# Patient Record
Sex: Female | Born: 1935
Health system: Southern US, Community
[De-identification: ages and names within clinical notes are randomized; demographics above are authoritative.]

## PROBLEM LIST (undated history)

## (undated) DIAGNOSIS — I639 Cerebral infarction, unspecified: Secondary | ICD-10-CM

## (undated) DIAGNOSIS — I6529 Occlusion and stenosis of unspecified carotid artery: Secondary | ICD-10-CM

## (undated) DIAGNOSIS — IMO0002 Reserved for concepts with insufficient information to code with codable children: Secondary | ICD-10-CM

## (undated) DIAGNOSIS — I10 Essential (primary) hypertension: Secondary | ICD-10-CM

## (undated) DIAGNOSIS — M1990 Unspecified osteoarthritis, unspecified site: Secondary | ICD-10-CM

## (undated) HISTORY — DX: Cerebral infarction, unspecified: I63.9

## (undated) HISTORY — DX: Occlusion and stenosis of unspecified carotid artery: I65.29

## (undated) HISTORY — PX: OTHER SURGICAL HISTORY: SHX169

---

## 2001-11-05 ENCOUNTER — Other Ambulatory Visit: Admission: RE | Admit: 2001-11-05 | Discharge: 2001-11-05 | Payer: Self-pay | Admitting: Obstetrics and Gynecology

## 2002-01-14 ENCOUNTER — Ambulatory Visit (HOSPITAL_COMMUNITY): Admission: RE | Admit: 2002-01-14 | Discharge: 2002-01-14 | Payer: Self-pay

## 2009-04-02 ENCOUNTER — Encounter: Admission: RE | Admit: 2009-04-02 | Discharge: 2009-04-02 | Payer: Self-pay | Admitting: Family Medicine

## 2009-04-26 ENCOUNTER — Ambulatory Visit: Payer: Self-pay | Admitting: Surgery

## 2009-05-14 ENCOUNTER — Encounter: Payer: Self-pay | Admitting: Surgery

## 2009-05-14 ENCOUNTER — Ambulatory Visit: Payer: Self-pay | Admitting: Surgery

## 2009-05-14 ENCOUNTER — Inpatient Hospital Stay (HOSPITAL_COMMUNITY): Admission: RE | Admit: 2009-05-14 | Discharge: 2009-05-15 | Payer: Self-pay | Admitting: Surgery

## 2009-05-14 HISTORY — PX: CAROTID ENDARTERECTOMY: SUR193

## 2009-06-07 ENCOUNTER — Ambulatory Visit: Payer: Self-pay | Admitting: Surgery

## 2009-12-13 ENCOUNTER — Ambulatory Visit: Payer: Self-pay | Admitting: Surgery

## 2010-07-04 ENCOUNTER — Ambulatory Visit: Payer: Self-pay | Admitting: Surgery

## 2010-12-31 LAB — COMPREHENSIVE METABOLIC PANEL
Alkaline Phosphatase: 64 U/L (ref 39–117)
BUN: 29 mg/dL — ABNORMAL HIGH (ref 6–23)
CO2: 26 mEq/L (ref 19–32)
Chloride: 109 mEq/L (ref 96–112)
Creatinine, Ser: 1.41 mg/dL — ABNORMAL HIGH (ref 0.4–1.2)
GFR calc non Af Amer: 35 mL/min — ABNORMAL LOW (ref >60)
Glucose, Bld: 93 mg/dL (ref 70–99)
Potassium: 4.5 mEq/L (ref 3.5–5.1)
Total Bilirubin: 0.5 mg/dL (ref 0.3–1.2)

## 2010-12-31 LAB — CBC
HCT: 34.2 % — ABNORMAL LOW (ref 36.0–46.0)
HCT: 38.3 % (ref 36.0–46.0)
Hemoglobin: 11.6 g/dL — ABNORMAL LOW (ref 12.0–15.0)
Hemoglobin: 13 g/dL (ref 12.0–15.0)
MCV: 96 fL (ref 78.0–100.0)
MCV: 96.8 fL (ref 78.0–100.0)
Platelets: 177 10*3/uL (ref 150–400)
Platelets: 180 10*3/uL (ref 150–400)
RDW: 13.1 % (ref 11.5–15.5)
WBC: 5.4 10*3/uL (ref 4.0–10.5)

## 2010-12-31 LAB — URINALYSIS, ROUTINE W REFLEX MICROSCOPIC
Bilirubin Urine: NEGATIVE
Glucose, UA: NEGATIVE mg/dL
Hgb urine dipstick: NEGATIVE
Ketones, ur: NEGATIVE mg/dL
Nitrite: NEGATIVE
Protein, ur: NEGATIVE mg/dL
Specific Gravity, Urine: 1.019 (ref 1.005–1.030)
Urobilinogen, UA: 1 mg/dL (ref 0.0–1.0)
pH: 6 (ref 5.0–8.0)

## 2010-12-31 LAB — BASIC METABOLIC PANEL
BUN: 20 mg/dL (ref 6–23)
Chloride: 107 mEq/L (ref 96–112)
GFR calc non Af Amer: 49 mL/min — ABNORMAL LOW (ref >60)
Glucose, Bld: 110 mg/dL — ABNORMAL HIGH (ref 70–99)
Potassium: 3.9 mEq/L (ref 3.5–5.1)
Sodium: 139 mEq/L (ref 135–145)

## 2010-12-31 LAB — URINE MICROSCOPIC-ADD ON

## 2010-12-31 LAB — APTT: aPTT: 31 seconds (ref 24–37)

## 2010-12-31 LAB — PROTIME-INR
INR: 0.9 (ref 0.00–1.49)
Prothrombin Time: 12.4 seconds (ref 11.6–15.2)

## 2011-02-07 NOTE — Procedures (Signed)
CAROTID DUPLEX EXAM   INDICATION:  Follow up carotid artery disease.   HISTORY:  Diabetes:  No.  Cardiac:  No.  Hypertension:  Yes.  Smoking:  No.  Previous Surgery:  Right CEA, 05/14/2009, by Dr. Myra Gianotti.  CV History:  Currently asymptomatic.  Amaurosis Fugax , Paresthesias , Hemiparesis                                       RIGHT             LEFT  Brachial systolic pressure:         128               132  Brachial Doppler waveforms:         Within normal limits                Within normal limits  Vertebral direction of flow:        Antegrade         Antegrade  DUPLEX VELOCITIES (cm/sec)  CCA peak systolic                   97                109  ECA peak systolic                   326               230  ICA peak systolic                   153               139  ICA end diastolic                   30                35  PLAQUE MORPHOLOGY:                  Mixed             Mixed  PLAQUE AMOUNT:                      Moderate          Moderate  PLAQUE LOCATION:                    ICA               ICA   IMPRESSION:  1. Right internal carotid artery velocities suggest 20% to 39%      stenosis with intimal thickening/homogenous plaque noted in the      bifurcation, common carotid artery, external carotid artery, status      post carotid endarterectomy.  2. Left internal carotid artery velocities suggest 20% to 39%      stenosis.  3. Homogenous plaque noted in the common carotid artery.   ___________________________________________  V. Charlena Cross, MD   EM/MEDQ  D:  07/04/2010  T:  07/04/2010  Job:  [id number]

## 2011-02-07 NOTE — Assessment & Plan Note (Signed)
OFFICE VISIT   Dorothy Noble, Dorothy Noble  DOB:  July 15, 1922                                       04/26/2009  [id number]#:[id number]   OTHER REFERRING PHYSICIAN:  L. Lupe Carney, M.D.   REASON FOR EVALUATION:  Carotid stenosis, asymptomatic.   HISTORY:  This is an 77 year old female I am seeing for evaluation and  management of right carotid stenosis.  The patient was seen by Dr.  Clovis Riley and found to have a right carotid bruit.  She was sent for  ultrasound and found to have high-grade right carotid stenosis in the 80  to 99% range.  She is asymptomatic.  She denies having any numbness or  weakness in either extremity.  She denies amaurosis fugax.  She denies  trouble with  speech or thought process.  Her only significant medical  history of hypertension and high cholesterol.  She does complain with  some trouble with her voice however this is thought to be secondary to  her reflux disease.  She does not have a history of cigarettes.  She  does not have a history of coronary artery disease.   FAMILY HISTORY:  Noncontributory.   SOCIAL HISTORY:  She is widowed with four children.   PAST MEDICAL HISTORY:  Hypertension, hypercholesterolemia, insomnia,  depression, reflux, WPW.   PAST SURGICAL HISTORY:  Tubal ligation and cataract.   REVIEW OF SYSTEMS:  GENERAL:  Negative fevers, chills weight gain,  weight loss.  CARDIAC:  Negative.  PULMONARY:  Positive for productive cough.  GI:  Positive for reflux.  GU:  Negative.  VASCULAR:  Negative.  NEURO:  Negative.  ORTHO:  Positive for arthritis and joint pain.  PSYCH:  Positive for depression and nervousness.   MEDICATIONS:  Vytorin, Sertraline, Maxzide, amlodipine and Lipitor.   ALLERGIES:  None.   PHYSICAL EXAMINATION:  Blood pressure 154/81, pulse 58, respirations 18.  General:  This is a well-appearing female in no acute distress.  HEENT:  Normocephalic, atraumatic.  Pupils equal.  Sclerae anicteric.  Neck :  Supple.  No JVD.  Positive right carotid bruit.  Cardiovascular:  Regular rate and rhythm.  Pulmonary:  Lungs are clear bilaterally.  Abdomen:  Soft, nontender.  Extremities:  Warm and well perfused.  Neurology:  Cranial II through XII are grossly intact.  Skin:  Without  rash.  Psych:  She is alert and x3.   DIAGNOSTIC TESTS:  Duplex ultrasound was repeated today.  This reveals  80 to 99  right carotid stenosis and 40 to 59% left.  Peak systolic  velocity on the right is 663 with end-diastolic velocity of 276.   ASSESSMENT/PLAN:  Asymptomatic right carotid stenosis.   PLAN:  I had a long conversation with the patient and her daughters  today.  The patient is 89, however, is very healthy and has a greater  than 5-year life expectancy.  We discussed the risks and benefits of  proceeding with an operation versus medical management.  After extensive  discussions, we have elected to proceed with a carotid endarterectomy.  We discussed the risk of stroke, risk of nerve injury, the risk of  cardiac complications.  I feel that the patient is  in very good health  at this point in time and with her velocity profile as high as it is  both systolic and diastolic that she would  receive benefit from  proceeding with carotid endarterectomy.  I have scheduled this for  Friday August 20.  She is going to have a Myoview performed for cardiac  clearance.   Jorge Ny, MD  Electronically Signed   VWB/MEDQ  D:  04/26/2009  T:  04/27/2009  Job:  [id number]   cc:   Gretta Arab. Valentina Lucks, M.D.  Elsworth Soho, M.D.

## 2011-02-07 NOTE — Procedures (Signed)
CAROTID DUPLEX EXAM   INDICATION:  Evaluation of carotid artery disease.   HISTORY:  Diabetes:  No.  Cardiac:  No.  Hypertension:  Yes.  Smoking:  No.  Previous Surgery:  No.  CV History:  No.  Amaurosis Fugax No, Paresthesias No, Hemiparesis No.                                       RIGHT             LEFT  Brachial systolic pressure:         152               148  Brachial Doppler waveforms:         Biphasic          Biphasic  Vertebral direction of flow:        Antegrade         Antegrade  DUPLEX VELOCITIES (cm/sec)  CCA peak systolic                   119               166  ECA peak systolic                   213               329  ICA peak systolic                   663               200  ICA end diastolic                   276               48  PLAQUE MORPHOLOGY:                  Calcified         Calcified  PLAQUE AMOUNT:                      Severe            Moderate  PLAQUE LOCATION:                    BIF, ICA, ECA     BIF, ICA, ECA   IMPRESSION:  1. 80-99% right internal carotid artery stenosis.  2. 40-59% left internal carotid artery stenosis.  3. Bilateral external carotid artery stenosis.        ___________________________________________  V. Charlena Cross, MD   AC/MEDQ  D:  04/26/2009  T:  04/26/2009  Job:  [id number]

## 2011-02-07 NOTE — Assessment & Plan Note (Signed)
OFFICE VISIT   Dorothy Noble, Dorothy Noble  DOB:  1921-10-17                                       12/13/2009  [id number]#:[id number]   REASON FOR VISIT:  Follow up carotid.   HISTORY:  This is an 77 year old female found to have a high-grade right  carotid stenosis.  She underwent right carotid endarterectomy on  05/14/2009.  She also required resection of a redundant common carotid  artery with primary anastomosis.  She had been doing very well at this  time.  We have also been following a 40-59% stenosis on the left.  She  says she is having hypersensitivity with smell.  She is also complaining  of a persistent marginal mandibular neurapraxia.  She complains of some  shooting pains going down her legs as well as leg swelling.   REVIEW OF SYSTEMS:  Positive for productive cough, reflux, headaches,  arthritis, depression and anxiety.  All other review of systems are  negative, as documented in the encounter form.   SOCIAL HISTORY:  She continues to be a nonsmoker, nondrinker.   PAST MEDICAL HISTORY:  Hypertension, hypercholesterolemia, carotid  disease.   PHYSICAL EXAMINATION:  Heart rate 58, blood pressure 152/67, O2 sats are  99%.  General:  She is well-appearing in no distress.  HEENT:  Within  normal limits.  Right carotid incision is well-healed.  Cardiovascular:  Regular rate and rhythm.  Extremities are well-perfused.  She has  bilateral pitting edema.  Abdomen:  Soft.  Musculoskeletal:  Without  major deformity.  Neuro:  She has no focal weakness.  Skin is without  rash.  The skin on her lower legs is very dry.   DIAGNOSTIC STUDIES:  Carotid ultrasound was independently reviewed  today.  This reveals 40-59% stenosis on the right carotid at the low end  of the range with intimal thickening and homogeneous plaque at the  bifurcation.  The left side is 40-59% and stable.   ASSESSMENT/PLAN:  1. Status post right carotid endarterectomy:  The patient is doing  very well at this time.  She will follow up with me in 6 months      with another ultrasound.  At that time, will also assess how her      marginal mandibular neurapraxia is resolving and will also evaluate      her left side, which has been stable.  2. Pitting edema:  The patient was encouraged to continue to wear her      compression stockings.  I will see her back in 6 months.     Jorge Ny, MD  Electronically Signed   VWB/MEDQ  D:  12/13/2009  T:  12/14/2009  Job:  [id number]   cc:   Gretta Arab. Valentina Lucks, M.D.  Dr. Clovis Riley

## 2011-02-07 NOTE — Assessment & Plan Note (Signed)
OFFICE VISIT   Dorothy Noble, Dorothy Noble  DOB:  [birth year]                                       06/07/2009  [id number]#:[id number]   REASON FOR VISIT:  Follow up carotid endarterectomy.   HISTORY:  This is an 77 year old female with a high-grade right carotid  stenosis with peak systolic velocity of 663 and end diastolic velocity  of 276.  After a lengthy discussion with the family about the risks and  benefits of proceeding, decision was made to proceed with a right  carotid endarterectomy.  This was performed without incident on  05/14/2009.  The patient did require resection of a redundant common  carotid artery with primary anastomosis at the time of operation.  She  comes back in today for followup.  She is doing very well.  She has no  neurologic deficits.  Her incision is well-healed.   The patient will come back to see me in 6 months.  We will evaluate both  sides with a carotid ultrasound at that time.  She has a known 40-59%  stenosis on the left.   Jorge Ny, MD  Electronically Signed   VWB/MEDQ  D:  06/07/2009  T:  06/08/2009  Job:  [id number]   cc:   Gretta Arab. Valentina Lucks, M.D.  Elsworth Soho, M.D.

## 2011-02-07 NOTE — Discharge Summary (Signed)
Dorothy Noble, Dorothy Noble                 ACCOUNT NO.:  [id number]   MEDICAL RECORD NO.:  [id number]          PATIENT TYPE:  INP   LOCATION:  3307                         FACILITY:  [facility]   PHYSICIAN:  Juleen China IV, MDDATE OF BIRTH:  [birth year]   DATE OF ADMISSION:  05/14/2009  DATE OF DISCHARGE:  05/15/2009                               DISCHARGE SUMMARY   ADMISSION DIAGNOSIS:  Severe right carotid artery stenosis,  asymptomatic.   FINAL DISCHARGE DIAGNOSES:  1. Severe right internal carotid artery stenosis, asymptomatic status      post right carotid endarterectomy as well as resection of redundant      right common carotid artery.  2. Hypertension.  3. Hypercholesterolemia.  4. Insomnia.  5. Depression.  6. Gastroesophageal reflux disease.  7. History of Wolff-Parkinson-White's.  8. History of tubal ligation and cataract surgery.  9. No known drug allergies.   PROCEDURES:  On May 14, 2009, right carotid endarterectomy with  bovine pericardial patch angioplasty and resection of right common  carotid artery with primary anastomosis, Dr. Venida Jarvis.   BRIEF HISTORY:  Dorothy Noble is an 77 year old black female with a high-  grade right carotid artery stenosis.  She has been asymptomatic.  She  was referred to vascular surgeon, Dr. Venida Jarvis who  recommended that she undergo elective right carotid endarterectomy to  reduce her risk for future stroke.  Duplex at the [facility] office revealed 80-  99% right internal carotid artery stenosis and 40-59% stenosis on the  left.   HOSPITAL COURSE:  Dorothy Noble was electively admitted to [facility]  She underwent the previously mentioned procedure.  Postoperatively, she was extubated neurologically intact after short-  stay recovery unit, was transferred to [facility] where she  remained until discharge.  She remained neurologically intact.  She did  have some marginal mandibular nerve palsy in the  right; but otherwise,  she had facial symmetry.  Tongue was midline.  She is moving all  extremities strongly and symmetrically.  Her incision had some mild soft  tissue edema, but no evidence of hematoma.  She denied dysphagia.  Her  vitals remained stable.  She maintained sinus rhythm.  Room air  oxygenation was 97%.  By late morning of postop day #1, she was  mobilizing, voiding, tolerating regular diet.  She was felt appropriate  for discharge home.  She lives alone, but daughters were able to provide  24-hour supervision for few days once the patient discharged.  Postoperative labs showed a white count of 8.6, hemoglobin 11.6,  hematocrit 34.2, platelet count 177.  Sodium 139, potassium 3.9, BUN of  20, creatinine 1.06, blood glucose 110.   DISPOSITION:  Dorothy Noble was discharged home in stable and improved  condition on postop day #1 on May 15, 2009.   DISCHARGE MEDICATIONS:  1. Amlodipine 10 mg p.o. daily.  2. Lisinopril/hydrochlorothiazide 20/12.5 mg p.o. daily.  3. Vitamin B12 supplement daily per home regimen.  4. Aspirin 81 mg p.o. daily.  5. Vitamin C supplement per home regimen daily.  6.  Fish oil capsule per home regimen daily.  7. Garlic supplement daily.  8. Red rice yeast supplement daily both per home regimen.  9. Percocet 5/325 mg 1 tablet p.o. q.4 h. p.r.n. for pain.   DISCHARGE INSTRUCTIONS:  She is to continue a heart-healthy diet.  May  shower and clean her incisions gently with soap and water.  Avoid  driving or heavy lifting for the next 2 weeks.  The patient reports she  no longer drives.  She should see Dr. Myra Gianotti in 2-3 weeks.  Should call  sooner if she has fever greater than 101, redness or drainage from her  incision sites, severe headache or neurologic changes.      Jerold Coombe, P.A.      Jorge Ny, MD  Electronically Signed    AWZ/MEDQ  D:  05/15/2009  T:  05/15/2009  Job:  [id number]   cc:   L. Lupe Carney, M.D.   Gretta Arab Valentina Lucks, M.D.

## 2011-02-07 NOTE — Procedures (Signed)
CAROTID DUPLEX EXAM   INDICATION:  Follow up carotid artery disease.   HISTORY:  Diabetes:  No.  Cardiac:  No.  Hypertension:  Yes.  Smoking:  No.  Previous Surgery:  Right CEA, 05/14/2009 by Dr. Myra Gianotti.  CV History:  Yes, after CEA, per patient, with residual facial.  Amaurosis Fugax No, Paresthesias No, Hemiparesis                                       RIGHT               LEFT  Brachial systolic pressure:         130                 132  Brachial Doppler waveforms:         WNL                 WNL  Vertebral direction of flow:        Antegrade           Antegrade  DUPLEX VELOCITIES (cm/sec)  CCA peak systolic                   73                  126  ECA peak systolic                   259                 194  ICA peak systolic                   127                 170  ICA end diastolic                   26                  44  PLAQUE MORPHOLOGY:                  Intimal thickening  Mixed  PLAQUE AMOUNT:                      Mild                Moderate  PLAQUE LOCATION:                    CCA/bifurcation/ECA ICA/ECA/CCA   IMPRESSION:  1. Right internal carotid artery velocities are suggestive of 40-59%      stenosis (low end of range) with intimal thickening/homogenous      plaque noted in the bifurcation/common carotid artery/external      carotid artery, status post carotid endarterectomy.  2. Left internal carotid artery shows evidence of 40-59% stenosis.  3. Bilateral external carotid artery stenosis.   ___________________________________________  V. Charlena Cross, MD   AS/MEDQ  D:  12/13/2009  T:  12/14/2009  Job:  [id number]

## 2011-02-07 NOTE — Op Note (Signed)
NAMEMALAYSIA, CRANCE                 ACCOUNT NO.:  [id number]   MEDICAL RECORD NO.:  [id number]          PATIENT TYPE:  INP   LOCATION:  3307                         FACILITY:  [facility]   PHYSICIAN:  Juleen China IV, MDDATE OF BIRTH:  02/07/1922   DATE OF PROCEDURE:  05/14/2009  DATE OF DISCHARGE:                               OPERATIVE REPORT   PREOPERATIVE DIAGNOSIS:  Asymptomatic right carotid stenosis.   POSTOPERATIVE DIAGNOSIS:  Asymptomatic right carotid stenosis.   PROCEDURES PERFORMED:  1. Right carotid endarterectomy with patch angioplasty.  2. Resection of right common carotid artery with primary anastomosis.   SURGEON:  1. Charlena Cross, MD   ASSISTANT:  Jerold Coombe, PA   ANESTHESIA:  General.   BLOOD LOSS:  50 mL.   SPECIMENS:  Plaque.   COMPLICATIONS:  None.   DRAINS:  None.   INDICATIONS:  This is an 77 year old female with high-grade right  carotid stenosis.  She comes in today for operative repair.   PROCEDURE:  The patient was identified in the holding area and taken to  room #9, and she was placed supine on the table.  General endotracheal  anesthesia was administered.  The patient was prepped and draped in  standard sterile fashion.  A time-out was called.  Antibiotics were  given.  Incision was made along the anterior border of the right  sternocleidomastoid.  Cautery was used to divide the subcutaneous  tissue.  The internal jugular vein was identified and mobilized along  its anterior medial border.  A dominant common facial vein was not  identified; however, there were multiple crossing venous branches, which  were divided between 3-0 silk ties.  The omohyoid and the  sternocleidomastoid muscle were then separated with Bovie cautery.  The  carotid sheath was then opened sharply.  Common carotid artery was  circumferentially dissected free and an umbilical tape was passed.  Then  began mobilizing along the lateral border of the common  carotid artery.  Branch was first identified which may be think that the external carotid  was rotated lateral indeed this turned out to the true.  I could not  rotate the external, internal carotid to their anatomic position; and  therefore, left the external carotid lateral.  The patient had a very  high bifurcation.  The hypoglossal nerve had to be fully mobilized.  The  occipital artery was divided between silk ties.  I mobilized the  internal carotid artery up as far as I could reasonably get to.  Once I  was satisfied with the exposure, the patient was given systemic  heparinization.  After the heparin had circulated, the common, external,  and internal carotid arteries were occluded.  An #11 blade was used to  make an arteriotomy, which was extended with Potts scissors.  The  patient had approximately 95% stenosis.  There was no thrombus  visualized.  There was ulcerative plaque at the bifurcation.  An 8-  [place] shunt was placed.  It was difficult to get into the appropriate  endarterectomy plane, as there was significant inflammation around  the  carotid bifurcation.  Ultimately, endarterectomy was performed using on  [employer] elevator.  Eversion endarterectomy was performed in the  external carotid artery.  A good distal endpoint was encountered.  Once  the endarterectomy was complete, the artery was copiously irrigated and  made sure there was no distal flap.  I did notice that the internal and  common carotid artery were very redundant; therefore, I felt primary  resection would be necessary.  Four stay sutures were placed in the  common carotid artery and approximately 1.5 cm of the common carotid  artery was resected.  This was done right up to the takeoff of the  external carotid artery.  A primary anastomosis was completed using  running 6-0 Prolene.  After this was done, bovine pericardial patch was  selected.  Patch angioplasty was performed using a running 6-0  Prolene.  Prior to completion, anastomosis of the shunt was removed.  The common,  external, and internal carotid arteries were all appropriately flushed.  The artery was filled with heparinized saline again.  The anastomosis  was then complete.  The external carotid clamp was released first  followed by the common carotid artery.  Approximately 30 seconds later,  the clamp on the internal carotid artery was released.  I did place 2  repair stitches for hemostasis.  The patient was given 50 mg of  protamine to reverse the heparin.  Vita-Sure was also used to aid with  hemostasis.  Once I was satisfied with the repair and the hemostasis,  the carotid sheath was reapproximated with 3-0 Vicryl, the platysma  muscles were reapproximated with 3-0 Vicryl, and the skin was closed  with running 4-0 Vicryl.  Dermabond was placed on the skin.  The patient  was successfully awakened from anesthesia.  She was found to be moving  all 4 extremities to command.  She was taken to the recovery room in  stable condition.      Jorge Ny, MD  Electronically Signed     VWB/MEDQ  D:  05/14/2009  T:  05/15/2009  Job:  [id number]

## 2011-07-10 ENCOUNTER — Other Ambulatory Visit (INDEPENDENT_AMBULATORY_CARE_PROVIDER_SITE_OTHER): Payer: Medicare Other | Admitting: *Deleted

## 2011-07-10 DIAGNOSIS — I6529 Occlusion and stenosis of unspecified carotid artery: Secondary | ICD-10-CM

## 2011-07-10 DIAGNOSIS — Z48812 Encounter for surgical aftercare following surgery on the circulatory system: Secondary | ICD-10-CM

## 2011-08-07 NOTE — Procedures (Unsigned)
CAROTID DUPLEX EXAM  INDICATION:  Follow up right CEA.  HISTORY: Diabetes:  No. Cardiac:  No. Hypertension:  Yes. Smoking:  No. Previous Surgery:  Right CEA, 05/14/2009. CV History: Amaurosis Fugax No, Paresthesias No, Hemiparesis No.                                      RIGHT             LEFT Brachial systolic pressure:         130               128 Brachial Doppler waveforms:         WNL               WNL Vertebral direction of flow:        Antegrade         Antegrade DUPLEX VELOCITIES (cm/sec) CCA peak systolic                   94                95 ECA peak systolic                   293               242 ICA peak systolic                   114               109 ICA end diastolic                   23                24 PLAQUE MORPHOLOGY:                  Heterogenous      Heterogenous PLAQUE AMOUNT:                      Moderate          Moderate PLAQUE LOCATION:                    CCA/ECA/ICA       CCA/ECA/ICA  IMPRESSION: 1. Patent right carotid endarterectomy. 2. 1% to 39% left internal carotid artery plaquing. 3. Significant intimal thickening bilaterally. 4. Significant stenosis of bilateral external carotid arteries. 5. Stable results compared to previous examination 1 year ago.  ___________________________________________ V. Charlena Cross, MD  LT/MEDQ  D:  07/10/2011  T:  07/10/2011  Job:  [id number]

## 2011-09-03 ENCOUNTER — Encounter: Payer: Self-pay | Admitting: *Deleted

## 2011-09-03 ENCOUNTER — Emergency Department (HOSPITAL_COMMUNITY)
Admission: EM | Admit: 2011-09-03 | Discharge: 2011-09-03 | Disposition: A | Discharge status: Home / Self Care | Payer: Medicare Other | Attending: Emergency Medicine | Admitting: Emergency Medicine

## 2011-09-03 ENCOUNTER — Emergency Department (HOSPITAL_COMMUNITY): Payer: Medicare Other

## 2011-09-03 DIAGNOSIS — M25519 Pain in unspecified shoulder: Secondary | ICD-10-CM | POA: Insufficient documentation

## 2011-09-03 DIAGNOSIS — M25619 Stiffness of unspecified shoulder, not elsewhere classified: Secondary | ICD-10-CM | POA: Insufficient documentation

## 2011-09-03 DIAGNOSIS — S43006A Unspecified dislocation of unspecified shoulder joint, initial encounter: Secondary | ICD-10-CM | POA: Insufficient documentation

## 2011-09-03 DIAGNOSIS — W19XXXA Unspecified fall, initial encounter: Secondary | ICD-10-CM | POA: Insufficient documentation

## 2011-09-03 HISTORY — DX: Unspecified osteoarthritis, unspecified site: M19.90

## 2011-09-03 HISTORY — DX: Essential (primary) hypertension: I10

## 2011-09-03 HISTORY — DX: Reserved for concepts with insufficient information to code with codable children: IMO0002

## 2011-09-03 MED ORDER — OXYCODONE-ACETAMINOPHEN 5-325 MG PO TABS
1.0000 | ORAL_TABLET | Freq: Once | ORAL | Status: AC
  Administered 2011-09-03: 1 via ORAL
  Filled 2011-09-03 (×2): qty 1

## 2011-09-03 MED ORDER — PROPOFOL 10 MG/ML IV EMUL
5.0000 mL | Freq: Once | INTRAVENOUS | Status: AC
  Administered 2011-09-03: 50 mg via INTRAVENOUS
  Filled 2011-09-03: qty 20

## 2011-09-03 MED ORDER — OXYCODONE-ACETAMINOPHEN 5-325 MG PO TABS: 2.0000 | ORAL_TABLET | ORAL | Status: AC | PRN

## 2011-09-03 MED ORDER — CLONIDINE HCL 0.1 MG PO TABS
0.2000 mg | ORAL_TABLET | Freq: Once | ORAL | Status: AC
  Administered 2011-09-03: 0.2 mg via ORAL
  Filled 2011-09-03: qty 2

## 2011-09-03 MED ORDER — SODIUM CHLORIDE 0.9 % IV SOLN
INTRAVENOUS | Status: DC
  Administered 2011-09-03: 17:00:00 via INTRAVENOUS

## 2011-09-03 NOTE — ED Notes (Signed)
Per EMS- Pt .tripped and fell while at church today. Pt. Has right shoulder deformity and pain that she rates 5/10. Pt. Denies hitting her head or LOC. Pt. Denies neck or back pain. BP-150/80, HR-72, R-18.

## 2011-09-03 NOTE — ED Notes (Signed)
Steward sedation score 6 

## 2011-09-03 NOTE — ED Notes (Signed)
02 via [place] removed. Will monitor sats.

## 2011-09-03 NOTE — ED Notes (Signed)
Patient returned from XR. 

## 2011-09-03 NOTE — ED Provider Notes (Signed)
History     CSN: [id number] Arrival date & time: 09/03/2011  3:14 PM   First MD Initiated Contact with Patient 09/03/11 1515      Chief Complaint  Patient presents with  . Fall  . Shoulder Injury    (Consider location/radiation/quality/duration/timing/severity/associated sxs/prior treatment) Patient is a 77 y.o. female presenting with fall and shoulder injury. The history is provided by the patient.  Fall Pertinent negatives include no abdominal pain, no nausea, no vomiting and no headaches.  Shoulder Injury Pertinent negatives include no chest pain, no abdominal pain, no headaches and no shortness of breath.   the patient is an 77 year old, right-hand dominant female, who complains of right shoulder pain after she fell at church.  She is going up an incline and fell over, and landed on her right shoulder.  She denies hitting her head.  She denies loss of consciousness, neck pain.  She denies pain anywhere else.  She denies chest pain, shortness breath, or palpitations.  Prior to falling.  She has not been recently ill.  No past medical history on file.  No past surgical history on file.  No family history on file.  History  Substance Use Topics  . Smoking status: Not on file  . Smokeless tobacco: Not on file  . Alcohol Use: Not on file    OB History    No data available      Review of Systems  HENT: Negative for neck pain.   Eyes: Negative for visual disturbance.  Respiratory: Negative for cough and shortness of breath.   Cardiovascular: Negative for chest pain.  Gastrointestinal: Negative for nausea, vomiting and abdominal pain.  Musculoskeletal: Negative for back pain.       Right shoulder pain No elbow, forearm or wrist pain on the right side  Skin: Negative for wound.  Neurological: Negative for syncope and headaches.  Psychiatric/Behavioral: Negative for confusion.  All other systems reviewed and are negative.    Allergies  Review of patient's allergies  indicates not on file.  Home Medications  No current outpatient prescriptions on file.  BP 244/78  Pulse 66  Temp(Src) 98.5 F (36.9 C) (Oral)  Resp 20  SpO2 97%  Physical Exam  Constitutional: She is oriented to person, place, and time. She appears well-developed and well-nourished.  HENT:  Head: Normocephalic and atraumatic.  Eyes: Pupils are equal, round, and reactive to light.  Neck: Normal range of motion.  Cardiovascular: Normal rate, regular rhythm and normal heart sounds.   No murmur heard. Pulmonary/Chest: Effort normal and breath sounds normal. No respiratory distress. She has no wheezes. She has no rales.  Abdominal: Soft. She exhibits no distension. There is no tenderness.  Musculoskeletal: She exhibits no edema.       Right shoulder tenderness to palpation with decreased range of motion due to pain.  No tenderness or deformity to the humerus, elbow, forearm or wrist on the right side.  Neurological: She is alert and oriented to person, place, and time. No cranial nerve deficit.  Skin: Skin is warm and dry. No rash noted. No erythema.  Psychiatric: She has a normal mood and affect. Her behavior is normal.    ED Course  Procedures (including critical care time) 77 year old, right-hand-dominant female, complains of right shoulder pain after fall.  She has tenderness and decreased range of motion.  We will perform an x-ray to look for a fracture or dislocation.  I will give her Percocet for pain. Labs Reviewed - No data  to display  PROCEDURAL SEDATION Consent was signed by the patient. Her last meal was approximately 1 PM today. The patient was placed on a monitor.  An IV was established.  Oxygen was provided. Propofol 50 mg bolus was given.  The patient went to sleep and her right shoulder was reduced successfully without complications. The patient woke back up and returned to her baseline mental status. Physician.  Time 15 minutes  SHOULDER REDUCTION  consent was  obtained for right shoulder relocation. After sedation was achieved with propofol.  Her right shoulder was easily reduced by external rotation and abduction. The shoulder was placed in a sling and a post reduction x-ray was obtained.  There were no complications.      MDM  Right shoulder ant  Dislocation - reduced HTN- no end organ damage        Nicholes Stairs, MD 09/03/11 [phone]

## 2011-09-03 NOTE — ED Notes (Signed)
Patient transported to X-ray 

## 2012-09-19 ENCOUNTER — Encounter: Payer: Self-pay | Admitting: Vascular Surgery

## 2012-11-11 ENCOUNTER — Other Ambulatory Visit: Payer: Self-pay | Admitting: *Deleted

## 2012-11-11 DIAGNOSIS — Z48812 Encounter for surgical aftercare following surgery on the circulatory system: Secondary | ICD-10-CM

## 2012-11-11 DIAGNOSIS — I6529 Occlusion and stenosis of unspecified carotid artery: Secondary | ICD-10-CM

## 2012-11-22 ENCOUNTER — Encounter: Payer: Self-pay | Admitting: Neurosurgery

## 2012-11-25 ENCOUNTER — Other Ambulatory Visit: Payer: Medicare Other

## 2012-11-25 ENCOUNTER — Ambulatory Visit: Payer: Medicare Other | Admitting: Neurosurgery

## 2012-12-23 ENCOUNTER — Other Ambulatory Visit: Payer: Medicare Other

## 2012-12-23 ENCOUNTER — Ambulatory Visit: Payer: Medicare Other | Admitting: Neurosurgery

## 2013-02-07 ENCOUNTER — Encounter: Payer: Self-pay | Admitting: Surgery

## 2013-02-10 ENCOUNTER — Other Ambulatory Visit (INDEPENDENT_AMBULATORY_CARE_PROVIDER_SITE_OTHER): Payer: Medicare Other | Admitting: Vascular Surgery

## 2013-02-10 ENCOUNTER — Ambulatory Visit (INDEPENDENT_AMBULATORY_CARE_PROVIDER_SITE_OTHER): Payer: Medicare Other | Admitting: Surgery

## 2013-02-10 ENCOUNTER — Encounter: Payer: Self-pay | Admitting: Surgery

## 2013-02-10 DIAGNOSIS — I6529 Occlusion and stenosis of unspecified carotid artery: Secondary | ICD-10-CM

## 2013-02-10 DIAGNOSIS — Z48812 Encounter for surgical aftercare following surgery on the circulatory system: Secondary | ICD-10-CM

## 2013-02-10 NOTE — Progress Notes (Signed)
[facility]   Patient name: Dorothy Noble MRN: [id number] DOB: [birth year] Sex: female     Chief Complaint  Patient presents with  . Carotid    chart audit - last OV 12/13/2009 - pt c/o "a tickle in my throat that makes me cough at times also causes me to become hoarse"    HISTORY OF PRESENT ILLNESS: The patient is back today for followup. She is status post right carotid endarterectomy in September of 2010 4 asymptomatic right carotid stenosis. The patient had a right marginal mandibular neurapraxia following her operation. This has resolved. She states that she has no evidence of numbness or weakness in either extremity, no slurred speech, no amaurosis fugax. She does state that she will occasionally get a very dry throat and her voice will give out or become worse. She continues to be medically managed for hypertension and hyperlipidemia.  Past Medical History  Diagnosis Date  . Hypertension   . Arthritis   . Herniated disc   . Stroke   . Carotid artery occlusion     Past Surgical History  Procedure Laterality Date  . Carotid endarderectomy    . Blood clot removed from r ankle    . Carotid endarterectomy      History   Social History  . Marital Status: Widowed    Spouse Name: N/A    Number of Children: N/A  . Years of Education: N/A   Occupational History  . Not on file.   Social History Main Topics  . Smoking status: Never Smoker   . Smokeless tobacco: Never Used  . Alcohol Use: No  . Drug Use: No  . Sexually Active: No   Other Topics Concern  . Not on file   Social History Narrative  . No narrative on file    Family History  Problem Relation Age of Onset  . Hyperlipidemia Daughter   . Hypertension Daughter   . Other Daughter     varicose veins    Allergies as of 02/10/2013  . (No Known Allergies)    Current Outpatient Prescriptions on File Prior to Visit  Medication Sig Dispense Refill  . aspirin EC 81 MG tablet Take  81 mg by mouth daily.        Marland Kitchen atenolol-chlorthalidone (TENORETIC) 50-25 MG per tablet Take 1 tablet by mouth daily.        Marland Kitchen ezetimibe (ZETIA) 10 MG tablet Take 10 mg by mouth daily.        . Multiple Vitamins-Minerals (MULTIVITAMINS THER. W/MINERALS) TABS Take 1 tablet by mouth daily.        Marland Kitchen OVER THE COUNTER MEDICATION Place 1 drop into both eyes daily. Refresh tears-over the counter        No current facility-administered medications on file prior to visit.     REVIEW OF SYSTEMS: Cardiovascular: Positive for pain in legs with walking, history of DVT, leg swelling, varicose veins  Pulmonary: No productive cough, asthma or wheezing. Neurologic: No weakness, paresthesias, aphasia, or amaurosis. No dizziness. Hematologic: No bleeding problems or clotting disorders. Musculoskeletal: No joint pain or joint swelling. Gastrointestinal: No blood in stool or hematemesis Genitourinary: No dysuria or hematuria. Psychiatric:: No history of major depression. Integumentary: No rashes or ulcers. Constitutional: No fever or chills.  PHYSICAL EXAMINATION:   Vital signs are BP 181/54  Pulse 45  Ht 5\' 2"  (1.575 m)  Wt 144 lb (65.318 kg)  BMI 26.33 kg/m2  SpO2 100% General: The patient  appears their stated age. HEENT:  No gross abnormalities Pulmonary:  Non labored breathing Musculoskeletal: There are no major deformities. Neurologic: No focal weakness or paresthesias are detected, Skin: There are no ulcer or rashes noted. Psychiatric: The patient has normal affect. Cardiovascular: There is a regular rate and rhythm without significant murmur appreciated.   Diagnostic Studies Carotid duplex was performed and reviewed today. This shows a widely patent right internal carotid artery endarterectomy site was re\re stenosis less than 40%. The left-sided stenosis is less than 40% as well  Assessment: Status post right carotid endarterectomy Plan: The patient is doing very well. I reiterated to  her today that she suffered a marginal mandibular neurapraxia after her operation which has completely results. Her family was concerned that this was a stroke. I clarified this to them today. This was definitely not a stroke but rather I nerve neuropraxia.  The patient does describe symptoms of vocal cord dysfunction. She states that she will occasionally have a hoarse voice or her voice will give out. She also describes possible aspiration of saliva. I offered to refer her to ENT to have this evaluated, however the patient did not wish to have this done. She states that it does not bother her very often.  The patient will come back in one year for followup carotid duplex ultrasound  V. Charlena Cross, M.D. [facility] [place] Office: [phone] Pager:  [phone]

## 2013-02-11 NOTE — Addendum Note (Signed)
Addended by: Sharee Pimple on: 02/11/2013 08:58 AM   Modules accepted: Orders

## 2013-11-07 ENCOUNTER — Other Ambulatory Visit: Payer: Self-pay | Admitting: Surgery

## 2013-11-07 DIAGNOSIS — I6529 Occlusion and stenosis of unspecified carotid artery: Secondary | ICD-10-CM

## 2014-01-21 ENCOUNTER — Encounter: Payer: Self-pay | Admitting: Family

## 2014-01-22 ENCOUNTER — Other Ambulatory Visit (HOSPITAL_COMMUNITY): Payer: Medicare Other

## 2014-01-22 ENCOUNTER — Encounter: Payer: Self-pay | Admitting: Family

## 2014-01-22 ENCOUNTER — Ambulatory Visit (HOSPITAL_COMMUNITY)
Admission: RE | Admit: 2014-01-22 | Discharge: 2014-01-22 | Disposition: A | Discharge status: Home / Self Care | Payer: Medicare Other | Source: Ambulatory Visit | Attending: Family | Admitting: Family

## 2014-01-22 ENCOUNTER — Ambulatory Visit (INDEPENDENT_AMBULATORY_CARE_PROVIDER_SITE_OTHER): Payer: Medicare Other | Admitting: Family

## 2014-01-22 ENCOUNTER — Ambulatory Visit: Payer: Medicare Other | Admitting: Family

## 2014-01-22 VITALS — BP 130/58 | HR 53 | Resp 16 | Ht 61.0 in | Wt 137.0 lb

## 2014-01-22 DIAGNOSIS — R200 Anesthesia of skin: Secondary | ICD-10-CM

## 2014-01-22 DIAGNOSIS — I6529 Occlusion and stenosis of unspecified carotid artery: Secondary | ICD-10-CM

## 2014-01-22 DIAGNOSIS — R079 Chest pain, unspecified: Secondary | ICD-10-CM

## 2014-01-22 DIAGNOSIS — R209 Unspecified disturbances of skin sensation: Secondary | ICD-10-CM

## 2014-01-22 NOTE — Patient Instructions (Addendum)
Stroke Prevention Some medical conditions and behaviors are associated with an increased chance of having a stroke. You may prevent a stroke by making healthy choices and managing medical conditions. HOW CAN I REDUCE MY RISK OF HAVING A STROKE?   Stay physically active. Get at least 30 minutes of activity on most or all days.  Do not smoke. It may also be helpful to avoid exposure to secondhand smoke.  Limit alcohol use. Moderate alcohol use is considered to be:  No more than 2 drinks per day for men.  No more than 1 drink per day for nonpregnant women.  Eat healthy foods. This involves  Eating 5 or more servings of fruits and vegetables a day.  Following a diet that addresses high blood pressure (hypertension), high cholesterol, diabetes, or obesity.  Manage your cholesterol levels.  A diet low in saturated fat, trans fat, and cholesterol and high in fiber may control cholesterol levels.  Take any prescribed medicines to control cholesterol as directed by your health care provider.  Manage your diabetes.  A controlled-carbohydrate, controlled-sugar diet is recommended to manage diabetes.  Take any prescribed medicines to control diabetes as directed by your health care provider.  Control your hypertension.  A low-salt (sodium), low-saturated fat, low-trans fat, and low-cholesterol diet is recommended to manage hypertension.  Take any prescribed medicines to control hypertension as directed by your health care provider.  Maintain a healthy weight.  A reduced-calorie, low-sodium, low-saturated fat, low-trans fat, low-cholesterol diet is recommended to manage weight.  Stop drug abuse.  Avoid taking birth control pills.  Talk to your health care provider about the risks of taking birth control pills if you are over 8 years old, smoke, get migraines, or have ever had a blood clot.  Get evaluated for sleep disorders (sleep apnea).  Talk to your health care provider about  getting a sleep evaluation if you snore a lot or have excessive sleepiness.  Take medicines as directed by your health care provider.  For some people, aspirin or blood thinners (anticoagulants) are helpful in reducing the risk of forming abnormal blood clots that can lead to stroke. If you have the irregular heart rhythm of atrial fibrillation, you should be on a blood thinner unless there is a good reason you cannot take them.  Understand all your medicine instructions.  Make sure that other other conditions (such as anemia or atherosclerosis) are addressed. SEEK IMMEDIATE MEDICAL CARE IF:   You have sudden weakness or numbness of the face, arm, or leg, especially on one side of the body.  Your face or eyelid droops to one side.  You have sudden confusion.  You have trouble speaking (aphasia) or understanding.  You have sudden trouble seeing in one or both eyes.  You have sudden trouble walking.  You have dizziness.  You have a loss of balance or coordination.  You have a sudden, severe headache with no known cause.  You have new chest pain or an irregular heartbeat. Any of these symptoms may represent a serious problem that is an emergency. Do not wait to see if the symptoms will go away. Get medical help at once. Call your local emergency services  (911 in [place] Do not drive yourself to the hospital. Document Released: 10/19/2004 Document Revised: 07/02/2013 Document Reviewed: 03/14/2013 [facility] Patient Information 2014 [place].   Venous Stasis or Chronic Venous Insufficiency Chronic venous insufficiency, also called venous stasis, is a condition that affects the veins in the legs. The condition  prevents blood from being pumped through these veins effectively. Blood may no longer be pumped effectively from the legs back to the heart. This condition can range from mild to severe. With proper treatment, you should be able to continue with an active life. CAUSES   Chronic venous insufficiency occurs when the vein walls become stretched, weakened, or damaged or when valves within the vein are damaged. Some common causes of this include:  High blood pressure inside the veins (venous hypertension).  Increased blood pressure in the leg veins from long periods of sitting or standing.  A blood clot that blocks blood flow in a vein (deep vein thrombosis).  Inflammation of a superficial vein (phlebitis) that causes a blood clot to form. RISK FACTORS Various things can make you more likely to develop chronic venous insufficiency, including:  Family history of this condition.  Obesity.  Pregnancy.  Sedentary lifestyle.  Smoking.  Jobs requiring long periods of standing or sitting in one place.  Being a certain age. Women in their 64s and 73s and men in their 84s are more likely to develop this condition. SIGNS AND SYMPTOMS  Symptoms may include:   Varicose veins.  Skin breakdown or ulcers.  Reddened or discolored skin on the leg.  Brown, smooth, tight, and painful skin just above the ankle, usually on the inside surface (lipodermatosclerosis).  Swelling. DIAGNOSIS  To diagnose this condition, your health care provider will take a medical history and do a physical exam. The following tests may be ordered to confirm the diagnosis:  Duplex ultrasound A procedure that produces a picture of a blood vessel and nearby organs and also provides information on blood flow through the blood vessel.  Plethysmography A procedure that tests blood flow.  A venogram, or venography A procedure used to look at the veins using X-ray and dye. TREATMENT The goals of treatment are to help you return to an active life and to minimize pain or disability. Treatment will depend on the severity of the condition. Medical procedures may be needed for severe cases. Treatment options may include:   Use of compression stockings. These can help with symptoms and lower  the chances of the problem getting worse, but they do not cure the problem.  Sclerotherapy A procedure involving an injection of a material that "dissolves" the damaged veins. Other veins in the network of blood vessels take over the function of the damaged veins.  Surgery to remove the vein or cut off blood flow through the vein (vein stripping or laser ablation surgery).  Surgery to repair a valve. HOME CARE INSTRUCTIONS   Wear compression stockings as directed by your health care provider.  Only take over-the-counter or prescription medicines for pain, discomfort, or fever as directed by your health care provider.  Follow up with your health care provider as directed. SEEK MEDICAL CARE IF:   You have redness, swelling, or increasing pain in the affected area.  You see a red streak or line that extends up or down from the affected area.  You have a breakdown or loss of skin in the affected area, even if the breakdown is small.  You have an injury to the affected area. SEEK IMMEDIATE MEDICAL CARE IF:   You have an injury and open wound in the affected area.  Your pain is severe and does not improve with medicine.  You have sudden numbness or weakness in the foot or ankle below the affected area, or you have trouble moving your foot  or ankle.  You have a fever or persistent symptoms for more than 2 3 days.  You have a fever and your symptoms suddenly get worse. MAKE SURE YOU:   Understand these instructions.  Will watch your condition.  Will get help right away if you are not doing well or get worse. Document Released: 01/15/2007 Document Revised: 07/02/2013 Document Reviewed: 05/19/2013 [facility] Patient Information 2014 [place].   Obtain 20-30 mm mercury graduated pressure knee high stockings, measure your legs in the morning as discussed, get the closest size to your measurements. Put the stockings on in the morning, remove at bedtime. Elevate legs above hip  level when not walking.

## 2014-01-22 NOTE — Progress Notes (Signed)
Established Carotid Patient   History of Present Illness  Dorothy Noble is a 80 y.o. female patient of Dr. Trula Slade who is status post right carotid endarterectomy on 05/14/2009 for asymptomatic right carotid stenosis. She returns today for routine surveillance. The patient had a right marginal mandibular neurapraxia following her operation. This has resolved. She states that she has no evidence of numbness or weakness in either extremity, no slurred speech, no amaurosis fugax. Pt denies any symptoms c/o claudication. Pt states she has no swelling in her lower legs in the morning, but does later in the day.   denies New Medical or Surgical History: she has an upcoming ophthalmologist appointment next week.  Pt Diabetic: No Pt smoker: non-smoker  Pt meds include: Statin : No, she does take Red Yeast Rice ASA: Yes Other anticoagulants/antiplatelets: no   Past Medical History  Diagnosis Date  . Hypertension   . Arthritis   . Herniated disc   . Stroke   . Carotid artery occlusion     Social History History  Substance Use Topics  . Smoking status: Never Smoker   . Smokeless tobacco: Never Used  . Alcohol Use: No    Family History Family History  Problem Relation Age of Onset  . Hyperlipidemia Daughter   . Hypertension Daughter   . Other Daughter     varicose veins    Surgical History Past Surgical History  Procedure Laterality Date  . Carotid endarderectomy    . Blood clot removed from r ankle    . Carotid endarterectomy      No Known Allergies  Current Outpatient Prescriptions  Medication Sig Dispense Refill  . aspirin EC 81 MG tablet Take 81 mg by mouth daily.        Marland Kitchen atenolol-chlorthalidone (TENORETIC) 50-25 MG per tablet Take 1 tablet by mouth daily.        Marland Kitchen esomeprazole (NEXIUM) 40 MG capsule Take 40 mg by mouth daily before breakfast.      . ezetimibe (ZETIA) 10 MG tablet Take 10 mg by mouth daily.        . Multiple Vitamins-Minerals (MULTIVITAMINS THER.  W/MINERALS) TABS Take 1 tablet by mouth daily.        . Omega-3 Fatty Acids (FISH OIL) 1000 MG CAPS Take by mouth 3 (three) times daily.      Marland Kitchen OVER THE COUNTER MEDICATION Place 1 drop into both eyes daily. Refresh tears-over the counter       . Red Yeast Rice Extract (RED YEAST RICE PO) Take 2 tablets by mouth at bedtime.       No current facility-administered medications for this visit.    Review of Systems : See HPI for pertinent positives and negatives.  Physical Examination  Filed Vitals:   01/22/14 1541 01/22/14 1545  BP: 160/76 130/58  Pulse: 53 53  Resp:  16  Height:  5\' 1"  (1.549 m)  Weight:  137 lb (62.143 kg)  SpO2:  93%  Body mass index is 25.9 kg/(m^2).   General: WDWN female in NAD GAIT: normal Eyes: PERRLA Pulmonary:  Non-labored, CTAB, Negative  Rales, Negative rhonchi, & Negative wheezing.  Cardiac: regular Rhythm ,  Negative detected murmur.  VASCULAR EXAM Carotid Bruits Left Right   Negative Negative    Aorta is not palpable. Radial pulses are 2+ palpable and equal.  Gastrointestinal: soft, nontender, BS WNL, no r/g,  negative masses.  Musculoskeletal: Negative muscle atrophy/wasting. M/S 5/5 throughout, Extremities without ischemic changes. 2+ bilateral pretibial pitting edema with hemosiderin deposits.  Neurologic: A&O X 3; Appropriate Affect ; SENSATION, Speech is normal CN 2-12 intact, Pain and light touch intact in extremities, Motor exam as listed above.   Non-Invasive Vascular Imaging CAROTID DUPLEX 01/22/2014   CEREBROVASCULAR DUPLEX EVALUATION    INDICATION: Carotid artery disease, complains of right hand numbness    PREVIOUS INTERVENTION(S): Right carotid endarterectomy 05/14/2009    DUPLEX EXAM: Carotid duplex    RIGHT  LEFT  Peak Systolic Velocities (cm/s) End Diastolic Velocities (cm/s) Plaque LOCATION Peak  Systolic Velocities (cm/s) End Diastolic Velocities (cm/s) Plaque  103 12 HT CCA PROXIMAL 132 14 HT  103 15 HT CCA MID 124 15 HT  108 11 HT CCA DISTAL 141 17 HT  326 35 HT ECA 314 13 HT  118 19 HT ICA PROXIMAL 164 28 HT  108 20 - ICA MID 144 27 -  138 27 - ICA DISTAL 143 26 -    N/A ICA / CCA Ratio (PSV) 1.82  Antegrade Vertebral Flow Antegrade  086 Brachial Systolic Pressure (mmHg) 761  Biphasic Brachial Artery Waveforms Triphasic    Plaque Morphology:  HM = Homogeneous, HT = Heterogeneous, CP = Calcific Plaque, SP = Smooth Plaque, IP = Irregular Plaque     ADDITIONAL FINDINGS:     IMPRESSION: 1. Patent right carotid artery endarterectomy site with less than 40% right internal carotid artery stenosis. 2. Less than 40% left internal carotid artery stenosis. 3. Bilateral external carotid artery stenosis.    Compared to the previous exam:  No significant change.    Assessment: Dorothy Noble is a 80 y.o. female who is status post right carotid endarterectomy on 05/14/2009 for asymptomatic right carotid stenosis. The patient had a right marginal mandibular neurapraxia following her operation. This has resolved. She states that she has no evidence of numbness or weakness in either extremity, no slurred speech, no amaurosis fugax. Patent right carotid artery endarterectomy site with less than 40% right internal carotid artery stenosis. Less than 40% left internal carotid artery stenosis. Bilateral external carotid artery stenosis. The  ICA stenosis is  Unchanged from previous exam. Chronic venous insufficiency changes in her lower legs, walking, elevation of legs when not walking, and graduated knee high compression hose to wear during the day was advised.   Plan: Follow-up in 1 year with Carotid Duplex scan.   I discussed in depth with the patient the nature of atherosclerosis, and emphasized the importance of maximal medical management including strict control of blood pressure, blood  glucose, and lipid levels, obtaining regular exercise, and continued cessation of smoking.  The patient is aware that without maximal medical management the underlying atherosclerotic disease process will progress, limiting the benefit of any interventions. The patient was given information about stroke prevention and what symptoms should prompt the patient to seek immediate medical care. Thank you for allowing [place] to participate in this patient's care.  Clemon Chambers, RN, MSN, FNP-C [facility] [place] Office: [facility] Physician: Oneida Alar  01/22/2014 3:36 PM

## 2014-02-23 ENCOUNTER — Other Ambulatory Visit (HOSPITAL_COMMUNITY): Payer: Medicare Other

## 2014-02-23 ENCOUNTER — Ambulatory Visit: Payer: Medicare Other | Admitting: Family

## 2014-12-22 ENCOUNTER — Other Ambulatory Visit: Payer: Self-pay | Admitting: Family

## 2014-12-22 DIAGNOSIS — I6523 Occlusion and stenosis of bilateral carotid arteries: Secondary | ICD-10-CM

## 2014-12-22 DIAGNOSIS — Z48812 Encounter for surgical aftercare following surgery on the circulatory system: Secondary | ICD-10-CM

## 2015-01-25 ENCOUNTER — Other Ambulatory Visit (HOSPITAL_COMMUNITY): Payer: Medicare Other

## 2015-01-25 ENCOUNTER — Ambulatory Visit: Payer: Medicare Other | Admitting: Family

## 2015-02-01 ENCOUNTER — Ambulatory Visit
Admission: RE | Admit: 2015-02-01 | Discharge: 2015-02-01 | Disposition: A | Discharge status: Home / Self Care | Payer: Medicare Other | Source: Ambulatory Visit | Attending: Family Medicine | Admitting: Family Medicine

## 2015-02-01 ENCOUNTER — Other Ambulatory Visit: Payer: Self-pay | Admitting: Family Medicine

## 2015-02-01 DIAGNOSIS — R609 Edema, unspecified: Secondary | ICD-10-CM

## 2015-02-23 ENCOUNTER — Encounter: Payer: Self-pay | Admitting: Family

## 2015-02-24 ENCOUNTER — Other Ambulatory Visit: Payer: Self-pay | Admitting: Surgery

## 2015-02-24 DIAGNOSIS — I6529 Occlusion and stenosis of unspecified carotid artery: Secondary | ICD-10-CM

## 2015-02-25 ENCOUNTER — Encounter: Payer: Self-pay | Admitting: Family

## 2015-02-25 ENCOUNTER — Ambulatory Visit (INDEPENDENT_AMBULATORY_CARE_PROVIDER_SITE_OTHER): Payer: Medicare Other | Admitting: Family

## 2015-02-25 ENCOUNTER — Ambulatory Visit (HOSPITAL_COMMUNITY)
Admission: RE | Admit: 2015-02-25 | Discharge: 2015-02-25 | Disposition: A | Discharge status: Home / Self Care | Payer: Medicare Other | Source: Ambulatory Visit | Attending: Family | Admitting: Family

## 2015-02-25 VITALS — BP 110/62 | HR 55 | Resp 16 | Ht 63.5 in | Wt 129.0 lb

## 2015-02-25 DIAGNOSIS — Z9889 Other specified postprocedural states: Secondary | ICD-10-CM

## 2015-02-25 DIAGNOSIS — I6523 Occlusion and stenosis of bilateral carotid arteries: Secondary | ICD-10-CM | POA: Insufficient documentation

## 2015-02-25 DIAGNOSIS — I6529 Occlusion and stenosis of unspecified carotid artery: Secondary | ICD-10-CM | POA: Diagnosis present

## 2015-02-25 DIAGNOSIS — Z48812 Encounter for surgical aftercare following surgery on the circulatory system: Secondary | ICD-10-CM

## 2015-02-25 NOTE — Patient Instructions (Signed)
Stroke Prevention Some medical conditions and behaviors are associated with an increased chance of having a stroke. You may prevent a stroke by making healthy choices and managing medical conditions. HOW CAN I REDUCE MY RISK OF HAVING A STROKE?   Stay physically active. Get at least 30 minutes of activity on most or all days.  Do not smoke. It may also be helpful to avoid exposure to secondhand smoke.  Limit alcohol use. Moderate alcohol use is considered to be:  No more than 2 drinks per day for men.  No more than 1 drink per day for nonpregnant women.  Eat healthy foods. This involves:  Eating 5 or more servings of fruits and vegetables a day.  Making dietary changes that address high blood pressure (hypertension), high cholesterol, diabetes, or obesity.  Manage your cholesterol levels.  Making food choices that are high in fiber and low in saturated fat, trans fat, and cholesterol may control cholesterol levels.  Take any prescribed medicines to control cholesterol as directed by your health care provider.  Manage your diabetes.  Controlling your carbohydrate and sugar intake is recommended to manage diabetes.  Take any prescribed medicines to control diabetes as directed by your health care provider.  Control your hypertension.  Making food choices that are low in salt (sodium), saturated fat, trans fat, and cholesterol is recommended to manage hypertension.  Take any prescribed medicines to control hypertension as directed by your health care provider.  Maintain a healthy weight.  Reducing calorie intake and making food choices that are low in sodium, saturated fat, trans fat, and cholesterol are recommended to manage weight.  Stop drug abuse.  Avoid taking birth control pills.  Talk to your health care provider about the risks of taking birth control pills if you are over 35 years old, smoke, get migraines, or have ever had a blood clot.  Get evaluated for sleep  disorders (sleep apnea).  Talk to your health care provider about getting a sleep evaluation if you snore a lot or have excessive sleepiness.  Take medicines only as directed by your health care provider.  For some people, aspirin or blood thinners (anticoagulants) are helpful in reducing the risk of forming abnormal blood clots that can lead to stroke. If you have the irregular heart rhythm of atrial fibrillation, you should be on a blood thinner unless there is a good reason you cannot take them.  Understand all your medicine instructions.  Make sure that other conditions (such as anemia or atherosclerosis) are addressed. SEEK IMMEDIATE MEDICAL CARE IF:   You have sudden weakness or numbness of the face, arm, or leg, especially on one side of the body.  Your face or eyelid droops to one side.  You have sudden confusion.  You have trouble speaking (aphasia) or understanding.  You have sudden trouble seeing in one or both eyes.  You have sudden trouble walking.  You have dizziness.  You have a loss of balance or coordination.  You have a sudden, severe headache with no known cause.  You have new chest pain or an irregular heartbeat. Any of these symptoms may represent a serious problem that is an emergency. Do not wait to see if the symptoms will go away. Get medical help at once. Call your local emergency services (911 in [place] Do not drive yourself to the hospital. Document Released: 10/19/2004 Document Revised: 01/26/2014 Document Reviewed: 03/14/2013 [employer] Patient Information 2015 [employer]. This information is not intended to replace advice given   to you by your health care provider. Make sure you discuss any questions you have with your health care provider.  

## 2015-02-25 NOTE — Addendum Note (Signed)
Addended by: Dorthula Rue L on: 02/25/2015 04:48 PM   Modules accepted: Orders

## 2015-02-25 NOTE — Progress Notes (Signed)
Established Carotid Patient   History of Present Illness  Dorothy Noble is a 81 y.o. female patient of Dr. Trula Slade who is status post right carotid endarterectomy on 05/14/2009 for asymptomatic right carotid stenosis. She returns today for routine surveillance. The patient had a right marginal mandibular neurapraxia following her operation. This has resolved. She states that she has no evidence of numbness or weakness in either extremity. She denies any history of stroke or TIA. Pt denies any symptoms of claudication, denies non healing wounds. Pt states she has no swelling in her lower legs in the morning, but does later in the day.  Pt denies New Medical or Surgical History: edema in legs with weeping in left leg started March 2016, seen by dermatologist re this; wears compression hose. Weeping stopped in left leg in May 2016 since she has been elevating her legs and using compression hose.  Pt Diabetic: No Pt smoker: non-smoker  Pt meds include: Statin : No, she does take Red Yeast Rice ASA: Yes Other anticoagulants/antiplatelets: no    Past Medical History  Diagnosis Date  . Hypertension   . Arthritis   . Herniated disc   . Stroke   . Carotid artery occlusion     Social History History  Substance Use Topics  . Smoking status: Never Smoker   . Smokeless tobacco: Never Used  . Alcohol Use: No    Family History Family History  Problem Relation Age of Onset  . Hyperlipidemia Daughter   . Hypertension Daughter   . Other Daughter     varicose veins  . Varicose Veins Daughter   . Cancer Sister     Breast  . Hyperlipidemia Sister   . Hypertension Sister   . Hypertension Daughter   . Hyperlipidemia Daughter     Surgical History Past Surgical History  Procedure Laterality Date  . Carotid endarderectomy    . Blood clot removed from r ankle    . Carotid endarterectomy Right 05-14-09    cea    No Known Allergies  Current Outpatient Prescriptions  Medication  Sig Dispense Refill  . aspirin EC 81 MG tablet Take 81 mg by mouth daily.      Marland Kitchen atenolol-chlorthalidone (TENORETIC) 50-25 MG per tablet Take 1 tablet by mouth daily.      Marland Kitchen esomeprazole (NEXIUM) 40 MG capsule Take 40 mg by mouth daily before breakfast.    . ezetimibe (ZETIA) 10 MG tablet Take 10 mg by mouth daily.      . Multiple Vitamins-Minerals (MULTIVITAMINS THER. W/MINERALS) TABS Take 1 tablet by mouth daily.      . Omega-3 Fatty Acids (FISH OIL) 1000 MG CAPS Take by mouth 3 (three) times daily.    Marland Kitchen OVER THE COUNTER MEDICATION Place 1 drop into both eyes daily. Refresh tears-over the counter     . Red Yeast Rice Extract (RED YEAST RICE PO) Take 2 tablets by mouth at bedtime.     No current facility-administered medications for this visit.    Review of Systems : See HPI for pertinent positives and negatives.  Physical Examination  Filed Vitals:   02/25/15 1513 02/25/15 1523  BP: 118/70 110/62  Pulse: 62 55  Resp:  16  Height:  5' 3.5" (1.613 m)  Weight:  129 lb (58.514 kg)  SpO2:  100%   Body mass index is 22.49 kg/(m^2).   General: WDWN female in NAD GAIT: normal Eyes: PERRLA Pulmonary: Non-labored, CTAB, Negative Rales, Negative rhonchi, & Negative wheezing.  Cardiac:  regular Rhythm, no detected murmur.  VASCULAR EXAM Carotid Bruits Left Right   Negative Negative   Aorta is not palpable. Radial pulses are 2+ palpable and equal.     Gastrointestinal: soft, nontender, BS WNL, no r/g,no palpable masses.  Musculoskeletal: Negative muscle atrophy/wasting. M/S 4/5 throughout, Extremities without ischemic changes. 2+ bilateral pitting edema on dorsal aspects of feet, knee high compression stocking in place left leg, kerlex and ace wrap in place left lower leg.  Neurologic: A&O X 3; Appropriate Affect, Speech is normal CN 2-12  intact, Pain and light touch intact in extremities, Motor exam as listed above.         Non-Invasive Vascular Imaging CAROTID DUPLEX 02/25/2015   CEREBROVASCULAR DUPLEX EVALUATION    INDICATION: Carotid artery disease    PREVIOUS INTERVENTION(S): Right carotid endarterectomy 05/14/2009    DUPLEX EXAM: Carotid duplex    RIGHT  LEFT  Peak Systolic Velocities (cm/s) End Diastolic Velocities (cm/s) Plaque LOCATION Peak Systolic Velocities (cm/s) End Diastolic Velocities (cm/s) Plaque  79 6 HT CCA PROXIMAL 104 15 HT  95 14 HT CCA MID 125 16 HT  105 13 HT CCA DISTAL 113 14 HT  233 13 HT ECA 311 11 HT  100 14 - ICA PROXIMAL 143 20 HT  113 18 - ICA MID 116 26 -  106 26 - ICA DISTAL 134 24 -    N/A ICA / CCA Ratio (PSV) 1.1  Antegrade Vertebral Flow Antegrade  176 Brachial Systolic Pressure (mmHg) 160  Biphasic Brachial Artery Waveforms Biphasic    Plaque Morphology:  HM = Homogeneous, HT = Heterogeneous, CP = Calcific Plaque, SP = Smooth Plaque, IP = Irregular Plaque  ADDITIONAL FINDINGS:     IMPRESSION: 1. Patent right carotid endarterectomy site with 1 - 49% internal carotid artery stenosis 2. 1 - 49% left internal carotid artery stenosis. 3. Bilateral external carotid artery stenosis    Compared to the previous exam:  No change since 01/22/2014     Assessment: Dorothy Noble is a 81 y.o. female who is status post right carotid endarterectomy on 05/14/2009 for asymptomatic right carotid stenosis. She has no history of stroke or TIA. Today's carotid Duplex suggests a patent right carotid endarterectomy site with 1 - 49% internal carotid artery stenosis, 1 - 49% left internal carotid artery stenosis, and bilateral external carotid artery stenosis. No change since 01/22/2014    Plan: Follow-up in 1 year with Carotid Duplex.   I discussed in depth with the patient the nature of atherosclerosis, and emphasized the importance of maximal medical management including strict control of  blood pressure, blood glucose, and lipid levels, obtaining regular exercise, and continued cessation of smoking.  The patient is aware that without maximal medical management the underlying atherosclerotic disease process will progress, limiting the benefit of any interventions. The patient was given information about stroke prevention and what symptoms should prompt the patient to seek immediate medical care. Thank you for allowing [place] to participate in this patient's care.  Clemon Chambers, RN, MSN, FNP-C [facility] Office: [phone]  Clinic Physician: Oneida Alar  02/25/2015 3:19 PM

## 2015-04-21 ENCOUNTER — Encounter (INDEPENDENT_AMBULATORY_CARE_PROVIDER_SITE_OTHER): Payer: Medicare Other | Admitting: Ophthalmology

## 2015-04-21 DIAGNOSIS — I10 Essential (primary) hypertension: Secondary | ICD-10-CM | POA: Diagnosis not present

## 2015-04-21 DIAGNOSIS — H43813 Vitreous degeneration, bilateral: Secondary | ICD-10-CM | POA: Diagnosis not present

## 2015-04-21 DIAGNOSIS — H35033 Hypertensive retinopathy, bilateral: Secondary | ICD-10-CM

## 2015-04-21 DIAGNOSIS — H34812 Central retinal vein occlusion, left eye: Secondary | ICD-10-CM | POA: Diagnosis not present

## 2015-04-21 DIAGNOSIS — H4312 Vitreous hemorrhage, left eye: Secondary | ICD-10-CM

## 2015-05-19 ENCOUNTER — Encounter (INDEPENDENT_AMBULATORY_CARE_PROVIDER_SITE_OTHER): Payer: Medicare Other | Admitting: Ophthalmology

## 2015-05-19 DIAGNOSIS — H43813 Vitreous degeneration, bilateral: Secondary | ICD-10-CM | POA: Diagnosis not present

## 2015-05-19 DIAGNOSIS — I10 Essential (primary) hypertension: Secondary | ICD-10-CM

## 2015-05-19 DIAGNOSIS — H4313 Vitreous hemorrhage, bilateral: Secondary | ICD-10-CM

## 2015-05-19 DIAGNOSIS — H34812 Central retinal vein occlusion, left eye: Secondary | ICD-10-CM

## 2015-05-19 DIAGNOSIS — H35033 Hypertensive retinopathy, bilateral: Secondary | ICD-10-CM | POA: Diagnosis not present

## 2015-06-17 ENCOUNTER — Encounter (INDEPENDENT_AMBULATORY_CARE_PROVIDER_SITE_OTHER): Payer: Medicare Other | Admitting: Ophthalmology

## 2015-06-17 DIAGNOSIS — H43813 Vitreous degeneration, bilateral: Secondary | ICD-10-CM | POA: Diagnosis not present

## 2015-06-17 DIAGNOSIS — H34812 Central retinal vein occlusion, left eye: Secondary | ICD-10-CM

## 2015-06-17 DIAGNOSIS — I10 Essential (primary) hypertension: Secondary | ICD-10-CM

## 2015-06-17 DIAGNOSIS — H35033 Hypertensive retinopathy, bilateral: Secondary | ICD-10-CM | POA: Diagnosis not present

## 2015-06-24 ENCOUNTER — Emergency Department (HOSPITAL_COMMUNITY): Payer: Medicare Other

## 2015-06-24 ENCOUNTER — Observation Stay (HOSPITAL_COMMUNITY)
Admission: EM | Admit: 2015-06-24 | Discharge: 2015-06-26 | Disposition: A | Discharge status: Home Health | Payer: Medicare Other | Attending: Internal Medicine | Admitting: Internal Medicine

## 2015-06-24 ENCOUNTER — Encounter (HOSPITAL_COMMUNITY): Payer: Self-pay | Admitting: Neurology

## 2015-06-24 DIAGNOSIS — Z6822 Body mass index (BMI) 22.0-22.9, adult: Secondary | ICD-10-CM | POA: Diagnosis not present

## 2015-06-24 DIAGNOSIS — I872 Venous insufficiency (chronic) (peripheral): Secondary | ICD-10-CM | POA: Insufficient documentation

## 2015-06-24 DIAGNOSIS — N183 Chronic kidney disease, stage 3 (moderate): Secondary | ICD-10-CM | POA: Diagnosis not present

## 2015-06-24 DIAGNOSIS — I129 Hypertensive chronic kidney disease with stage 1 through stage 4 chronic kidney disease, or unspecified chronic kidney disease: Secondary | ICD-10-CM | POA: Diagnosis not present

## 2015-06-24 DIAGNOSIS — R2681 Unsteadiness on feet: Secondary | ICD-10-CM | POA: Diagnosis not present

## 2015-06-24 DIAGNOSIS — E860 Dehydration: Secondary | ICD-10-CM | POA: Diagnosis not present

## 2015-06-24 DIAGNOSIS — I441 Atrioventricular block, second degree: Secondary | ICD-10-CM | POA: Diagnosis not present

## 2015-06-24 DIAGNOSIS — Z7982 Long term (current) use of aspirin: Secondary | ICD-10-CM | POA: Diagnosis not present

## 2015-06-24 DIAGNOSIS — Z8673 Personal history of transient ischemic attack (TIA), and cerebral infarction without residual deficits: Secondary | ICD-10-CM | POA: Diagnosis not present

## 2015-06-24 DIAGNOSIS — E440 Moderate protein-calorie malnutrition: Secondary | ICD-10-CM | POA: Diagnosis not present

## 2015-06-24 DIAGNOSIS — R001 Bradycardia, unspecified: Secondary | ICD-10-CM | POA: Insufficient documentation

## 2015-06-24 DIAGNOSIS — N179 Acute kidney failure, unspecified: Secondary | ICD-10-CM | POA: Insufficient documentation

## 2015-06-24 DIAGNOSIS — R531 Weakness: Principal | ICD-10-CM

## 2015-06-24 DIAGNOSIS — I10 Essential (primary) hypertension: Secondary | ICD-10-CM

## 2015-06-24 LAB — CBC
HEMATOCRIT: 42.7 % (ref 36.0–46.0)
HEMOGLOBIN: 14.4 g/dL (ref 12.0–15.0)
MCH: 31.7 pg (ref 26.0–34.0)
MCHC: 33.7 g/dL (ref 30.0–36.0)
MCV: 94.1 fL (ref 78.0–100.0)
PLATELETS: 178 10*3/uL (ref 150–400)
RBC: 4.54 MIL/uL (ref 3.87–5.11)
RDW: 13.6 % (ref 11.5–15.5)
WBC: 6.5 10*3/uL (ref 4.0–10.5)

## 2015-06-24 LAB — CREATININE, SERUM
Creatinine, Ser: 1.37 mg/dL — ABNORMAL HIGH (ref 0.44–1.00)
GFR calc non Af Amer: 33 mL/min — ABNORMAL LOW (ref >60)
GFR, EST AFRICAN AMERICAN: 38 mL/min — AB (ref >60)

## 2015-06-24 LAB — URINALYSIS, ROUTINE W REFLEX MICROSCOPIC
BILIRUBIN URINE: NEGATIVE
Glucose, UA: 100 mg/dL — AB
Ketones, ur: 15 mg/dL — AB
LEUKOCYTES UA: NEGATIVE
NITRITE: NEGATIVE
Protein, ur: 100 mg/dL — AB
SPECIFIC GRAVITY, URINE: 1.011 (ref 1.005–1.030)
UROBILINOGEN UA: 0.2 mg/dL (ref 0.0–1.0)
pH: 7.5 (ref 5.0–8.0)

## 2015-06-24 LAB — HEPATIC FUNCTION PANEL
ALBUMIN: 3.9 g/dL (ref 3.5–5.0)
ALT: 32 U/L (ref 14–54)
AST: 40 U/L (ref 15–41)
Alkaline Phosphatase: 104 U/L (ref 38–126)
Bilirubin, Direct: <0.1 mg/dL — ABNORMAL LOW (ref 0.1–0.5)
TOTAL PROTEIN: 8.3 g/dL — AB (ref 6.5–8.1)
Total Bilirubin: 0.7 mg/dL (ref 0.3–1.2)

## 2015-06-24 LAB — CBC WITH DIFFERENTIAL/PLATELET
Basophils Absolute: 0 10*3/uL (ref 0.0–0.1)
Basophils Relative: 0 %
Eosinophils Absolute: 0 10*3/uL (ref 0.0–0.7)
Eosinophils Relative: 0 %
HEMATOCRIT: 47.9 % — AB (ref 36.0–46.0)
HEMOGLOBIN: 16 g/dL — AB (ref 12.0–15.0)
LYMPHS ABS: 0.9 10*3/uL (ref 0.7–4.0)
LYMPHS PCT: 18 %
MCH: 31.4 pg (ref 26.0–34.0)
MCHC: 33.4 g/dL (ref 30.0–36.0)
MCV: 94.1 fL (ref 78.0–100.0)
MONOS PCT: 4 %
Monocytes Absolute: 0.2 10*3/uL (ref 0.1–1.0)
NEUTROS PCT: 78 %
Neutro Abs: 4.1 10*3/uL (ref 1.7–7.7)
Platelets: 177 10*3/uL (ref 150–400)
RBC: 5.09 MIL/uL (ref 3.87–5.11)
RDW: 13.3 % (ref 11.5–15.5)
WBC: 5.2 10*3/uL (ref 4.0–10.5)

## 2015-06-24 LAB — CK TOTAL AND CKMB (NOT AT ARMC)
CK, MB: 3.3 ng/mL (ref 0.5–5.0)
Relative Index: INVALID (ref 0.0–2.5)
Total CK: 38 U/L (ref 38–234)

## 2015-06-24 LAB — BASIC METABOLIC PANEL
Anion gap: 12 (ref 5–15)
BUN: 28 mg/dL — AB (ref 6–20)
CHLORIDE: 103 mmol/L (ref 101–111)
CO2: 24 mmol/L (ref 22–32)
Calcium: 10.4 mg/dL — ABNORMAL HIGH (ref 8.9–10.3)
Creatinine, Ser: 1.24 mg/dL — ABNORMAL HIGH (ref 0.44–1.00)
GFR calc Af Amer: 43 mL/min — ABNORMAL LOW (ref >60)
GFR calc non Af Amer: 37 mL/min — ABNORMAL LOW (ref >60)
GLUCOSE: 121 mg/dL — AB (ref 65–99)
POTASSIUM: 5.6 mmol/L — AB (ref 3.5–5.1)
Sodium: 139 mmol/L (ref 135–145)

## 2015-06-24 LAB — TROPONIN I: Troponin I: <0.03 ng/mL (ref <0.031)

## 2015-06-24 LAB — I-STAT TROPONIN, ED: Troponin i, poc: 0.01 ng/mL (ref 0.00–0.08)

## 2015-06-24 LAB — URINE MICROSCOPIC-ADD ON

## 2015-06-24 LAB — POTASSIUM: Potassium: 4.6 mmol/L (ref 3.5–5.1)

## 2015-06-24 MED ORDER — HYDRALAZINE HCL 20 MG/ML IJ SOLN
10.0000 mg | INTRAMUSCULAR | Status: AC
  Administered 2015-06-24: 10 mg via INTRAVENOUS
  Filled 2015-06-24: qty 1

## 2015-06-24 MED ORDER — DOXYCYCLINE HYCLATE 100 MG PO TABS
100.0000 mg | ORAL_TABLET | Freq: Two times a day (BID) | ORAL | Status: DC
  Administered 2015-06-24 – 2015-06-26 (×4): 100 mg via ORAL
  Filled 2015-06-24 (×8): qty 1

## 2015-06-24 MED ORDER — ASPIRIN EC 81 MG PO TBEC
81.0000 mg | DELAYED_RELEASE_TABLET | Freq: Every day | ORAL | Status: DC
  Administered 2015-06-24 – 2015-06-26 (×3): 81 mg via ORAL
  Filled 2015-06-24 (×3): qty 1

## 2015-06-24 MED ORDER — ACETAMINOPHEN 325 MG PO TABS: 650.0000 mg | ORAL_TABLET | Freq: Four times a day (QID) | ORAL | Status: DC | PRN

## 2015-06-24 MED ORDER — ADULT MULTIVITAMIN W/MINERALS CH
1.0000 | ORAL_TABLET | Freq: Every day | ORAL | Status: DC
  Administered 2015-06-25 – 2015-06-26 (×2): 1 via ORAL
  Filled 2015-06-24 (×2): qty 1

## 2015-06-24 MED ORDER — ONDANSETRON HCL 4 MG PO TABS
4.0000 mg | ORAL_TABLET | Freq: Four times a day (QID) | ORAL | Status: DC | PRN
Start: 2015-06-24 — End: 2015-06-26

## 2015-06-24 MED ORDER — SODIUM CHLORIDE 0.9 % IV SOLN
INTRAVENOUS | Status: DC
  Administered 2015-06-24 – 2015-06-25 (×3): via INTRAVENOUS

## 2015-06-24 MED ORDER — ACETAMINOPHEN 650 MG RE SUPP: 650.0000 mg | Freq: Four times a day (QID) | RECTAL | Status: DC | PRN

## 2015-06-24 MED ORDER — ONDANSETRON HCL 4 MG/2ML IJ SOLN: 4.0000 mg | Freq: Four times a day (QID) | INTRAMUSCULAR | Status: DC | PRN

## 2015-06-24 MED ORDER — ENOXAPARIN SODIUM 30 MG/0.3ML ~~LOC~~ SOLN
30.0000 mg | SUBCUTANEOUS | Status: DC
  Administered 2015-06-24 – 2015-06-25 (×2): 30 mg via SUBCUTANEOUS
  Filled 2015-06-24 (×2): qty 0.3

## 2015-06-24 MED ORDER — SODIUM CHLORIDE 0.9 % IJ SOLN
3.0000 mL | Freq: Two times a day (BID) | INTRAMUSCULAR | Status: DC
  Administered 2015-06-26: 3 mL via INTRAVENOUS

## 2015-06-24 MED ORDER — LABETALOL HCL 5 MG/ML IV SOLN
10.0000 mg | INTRAVENOUS | Status: DC | PRN
  Filled 2015-06-24: qty 4

## 2015-06-24 MED ORDER — ATENOLOL 50 MG PO TABS
50.0000 mg | ORAL_TABLET | Freq: Every day | ORAL | Status: DC
  Administered 2015-06-24: 50 mg via ORAL
  Filled 2015-06-24 (×2): qty 1

## 2015-06-24 NOTE — ED Notes (Signed)
EDP at the bedside.  ?

## 2015-06-24 NOTE — ED Notes (Signed)
Per [facility]- Pt reports feeling weak since last night, also numbness tingling to right arm for several months. Pt lives alone. Pt is alert and oriented. BP 130 palpated, HR 66.

## 2015-06-24 NOTE — ED Notes (Signed)
Report attempted, RN states is change of shift they will call back when ready, bed assign for 20 min already.

## 2015-06-24 NOTE — ED Notes (Signed)
Pt eating at this time we will ambulate pt as soon as she finish with food.

## 2015-06-24 NOTE — ED Provider Notes (Signed)
CSN: [id number]     Arrival date & time 06/24/15  1049 History   First MD Initiated Contact with Patient 06/24/15 1049     Chief Complaint  Patient presents with  . Weakness     (Consider location/radiation/quality/duration/timing/severity/associated sxs/prior Treatment) The history is provided by the patient and medical records.    This is a 81 year old female with history of hypertension, arthritis, carotid artery occlusion status post right carotid endarterectomy in 2010, prior CVA, presenting to the ED for weakness since last night at unknown time.  Weakness appears generalized, no focal weakness.  No falls or other injuries-- patient uses walker at baseline.  Does have some paresthesias of her right arm which have been chronic for several months-- was told was due to decreased circulation by vascular surgery, Dr. Trula Slade.  She also had some alleged nerve damage from shoulder dislocation several years ago. Patient has continued eating and drinking normally, no abdominal pain, vomiting, or diarrhea.  No fever, chills.  No chest pain or SOB.  Patient states she has been urinating more frequently than normal but denies dysuria or hematuria.  Family has been arranging home health for patient at home.  VSS.  Past Medical History  Diagnosis Date  . Hypertension   . Arthritis   . Herniated disc   . Stroke   . Carotid artery occlusion    Past Surgical History  Procedure Laterality Date  . Carotid endarderectomy    . Blood clot removed from r ankle    . Carotid endarterectomy Right 05-14-09    cea   Family History  Problem Relation Age of Onset  . Hyperlipidemia Daughter   . Hypertension Daughter   . Other Daughter     varicose veins  . Varicose Veins Daughter   . Cancer Sister     Breast  . Hyperlipidemia Sister   . Hypertension Sister   . Hypertension Daughter   . Hyperlipidemia Daughter    Social History  Substance Use Topics  . Smoking status: Never Smoker   . Smokeless  tobacco: Never Used  . Alcohol Use: No   OB History    No data available     Review of Systems  Neurological: Positive for weakness.  All other systems reviewed and are negative.     Allergies  Review of patient's allergies indicates no known allergies.  Home Medications   Prior to Admission medications   Medication Sig Start Date End Date Taking? Authorizing Provider  aspirin EC 81 MG tablet Take 81 mg by mouth daily.      Historical Provider, MD  atenolol (TENORMIN) 50 MG tablet Take 50 mg by mouth daily. 02/15/15   Historical Provider, MD  atenolol-chlorthalidone (TENORETIC) 50-25 MG per tablet Take 1 tablet by mouth daily.      Historical Provider, MD  esomeprazole (NEXIUM) 40 MG capsule Take 40 mg by mouth daily before breakfast.    Historical Provider, MD  ezetimibe (ZETIA) 10 MG tablet Take 10 mg by mouth daily.      Historical Provider, MD  fluticasone (FLONASE) 50 MCG/ACT nasal spray as needed. 01/26/15   Historical Provider, MD  Multiple Vitamins-Minerals (MULTIVITAMINS THER. W/MINERALS) TABS Take 1 tablet by mouth daily.      Historical Provider, MD  Omega-3 Fatty Acids (FISH OIL) 1000 MG CAPS Take by mouth 3 (three) times daily.    Historical Provider, MD  OVER THE COUNTER MEDICATION Place 1 drop into both eyes daily. Refresh tears-over the counter  Historical Provider, MD  Red Yeast Rice Extract (RED YEAST RICE PO) Take 2 tablets by mouth at bedtime.    Historical Provider, MD  triamcinolone cream (KENALOG) 0.1 % Apply topically as needed. 02/19/15   Historical Provider, MD   BP 129/87 mmHg  Pulse 69  Temp(Src) 97.4 F (36.3 C) (Oral)  Resp 14  SpO2 99%   Physical Exam  Constitutional: She is oriented to person, place, and time. She appears well-developed and well-nourished.  Elderly, NAD  HENT:  Head: Normocephalic and atraumatic.  Mouth/Throat: Oropharynx is clear and moist.  Eyes: Conjunctivae and EOM are normal. Pupils are equal, round, and reactive to  light.  Neck: Normal range of motion.  Cardiovascular: Normal rate, regular rhythm and normal heart sounds.   Pulmonary/Chest: Effort normal and breath sounds normal.  Abdominal: Soft. Bowel sounds are normal.  Musculoskeletal: Normal range of motion.  Unna boots in place BLE; minimal weeping and odor  Neurological: She is alert and oriented to person, place, and time.  AAOx3, answering questions appropriately; equal strength UE and LE bilaterally; CN grossly intact; moves all extremities appropriately without ataxia; no focal neuro deficits or facial asymmetry appreciated  Skin: Skin is warm and dry.  Psychiatric: She has a normal mood and affect.  Nursing note and vitals reviewed.   ED Course  Procedures (including critical care time) Labs Review Labs Reviewed  CBC WITH DIFFERENTIAL/PLATELET - Abnormal; Notable for the following:    Hemoglobin 16.0 (*)    HCT 47.9 (*)    All other components within normal limits  BASIC METABOLIC PANEL - Abnormal; Notable for the following:    Potassium 5.6 (*)    Glucose, Bld 121 (*)    BUN 28 (*)    Creatinine, Ser 1.24 (*)    Calcium 10.4 (*)    GFR calc non Af Amer 37 (*)    GFR calc Af Amer 43 (*)    All other components within normal limits  URINALYSIS, ROUTINE W REFLEX MICROSCOPIC (NOT AT [facility]) - Abnormal; Notable for the following:    Glucose, UA 100 (*)    Hgb urine dipstick SMALL (*)    Ketones, ur 15 (*)    Protein, ur 100 (*)    All other components within normal limits  URINE MICROSCOPIC-ADD ON  POTASSIUM  Randolm Idol, ED    Imaging Review Dg Chest 2 View  06/24/2015   CLINICAL DATA:  Chest pain. Weakness. Initial encounter. RIGHT arm numbness and tingling for several months.  EXAM: CHEST  2 VIEW  COMPARISON:  05/11/2009.  FINDINGS: Progressive enlargement of the cardiopericardial silhouette compatible with CHF. Mild pulmonary vascular congestion. Old granulomatous disease with calcified RIGHT hilar lymph nodes.  Partially radiopaque monitoring weights project over the chest. Aortic arch atherosclerosis. Blunting of the RIGHT costophrenic angle suggesting a small pleural effusion. There may be a small LEFT pleural effusion is well.  IMPRESSION: Mild CHF.   Electronically Signed   By: Dereck Ligas M.D.   On: 06/24/2015 11:59   Ct Head Wo Contrast  06/24/2015   CLINICAL DATA:  Weakness since the night of 06/23/2015. Initial encounter.  EXAM: CT HEAD WITHOUT CONTRAST  TECHNIQUE: Contiguous axial images were obtained from the base of the skull through the vertex without intravenous contrast.  COMPARISON:  None.  FINDINGS: The patient has remote right parietal infarct. There is chronic microvascular ischemic change. No evidence of acute intracranial abnormality including hemorrhage, acute infarction, mass lesion, mass effect, midline shift or  abnormal extra-axial fluid collection is identified. No hydrocephalus or pneumocephalus. The calvarium is intact. Imaged paranasal sinuses and mastoid air cells are clear. Atherosclerotic vascular disease noted.  IMPRESSION: No acute abnormality.  Atrophy, chronic microvascular ischemic change and remote right parietal infarct.   Electronically Signed   By: Inge Rise M.D.   On: 06/24/2015 11:38   I have personally reviewed and evaluated these images and lab results as part of my medical decision-making.   EKG Interpretation None      MDM   Final diagnoses:  Generalized weakness  Essential hypertension   81 year old female here with generalized weakness. No focal deficits noted on exam. She reports paresthesias of right arm which have been ongoing for the past several months, this appears largely unchanged and seems related to poor vasculature and nerve damage from prior shoulder dislocation. Patient is hypertensive which will require intervention here in the emergency department. Lab work was obtained, renal function appears at baseline. There is hyperkalemia with  noted hemolysis-- repeat sent and WNL.  UA without signs of infection. Chest x-ray and CT head reassuring.  BP remains elevated however no signs of end organ damage on exam.  Patient give dose of hydralizine IV.  Will monitor for improvement.  3:15 PM Care signed out to PA Camprubi-Soms at shift change.  Will re-check BP. If BP decreases to at least 160-170/100 range and patient able to ambulate with walker which is her baseline, feel she can be discharged with close PCP FU.    Case discussed with attending physician, Dr. Betsey Holiday, who evaluated patient and agrees with assessment and plan of care.  Larene Pickett, PA-C 06/24/15 1519  Orpah Greek, MD 06/24/15 [phone]

## 2015-06-24 NOTE — Discharge Instructions (Signed)
Stay well hydrated. Take your home medications regularly. Follow up with your regular doctor in 3-5 days for recheck and ongoing management. Return to the ER for changes or worsening symptoms.   Hypertension Hypertension is another name for high blood pressure. High blood pressure forces your heart to work harder to pump blood. A blood pressure reading has two numbers, which includes a higher number over a lower number (example: 110/72). HOME CARE   Have your blood pressure rechecked by your doctor.  Only take medicine as told by your doctor. Follow the directions carefully. The medicine does not work as well if you skip doses. Skipping doses also puts you at risk for problems.  Do not smoke.  Monitor your blood pressure at home as told by your doctor. GET HELP IF:  You think you are having a reaction to the medicine you are taking.  You have repeat headaches or feel dizzy.  You have puffiness (swelling) in your ankles.  You have trouble with your vision. GET HELP RIGHT AWAY IF:   You get a very bad headache and are confused.  You feel weak, numb, or faint.  You get chest or belly (abdominal) pain.  You throw up (vomit).  You cannot breathe very well. MAKE SURE YOU:   Understand these instructions.  Will watch your condition.  Will get help right away if you are not doing well or get worse. Document Released: 02/28/2008 Document Revised: 09/16/2013 Document Reviewed: 07/04/2013 [facility] Patient Information 2015 [place], [place]. This information is not intended to replace advice given to you by your health care provider. Make sure you discuss any questions you have with your health care provider.  DASH Eating Plan DASH stands for "Dietary Approaches to Stop Hypertension." The DASH eating plan is a healthy eating plan that has been shown to reduce high blood pressure (hypertension). Additional health benefits may include reducing the risk of type 2 diabetes mellitus, heart  disease, and stroke. The DASH eating plan may also help with weight loss. WHAT DO I NEED TO KNOW ABOUT THE DASH EATING PLAN? For the DASH eating plan, you will follow these general guidelines:  Choose foods with a percent daily value for sodium of less than 5% (as listed on the food label).  Use salt-free seasonings or herbs instead of table salt or sea salt.  Check with your health care provider or pharmacist before using salt substitutes.  Eat lower-sodium products, often labeled as "lower sodium" or "no salt added."  Eat fresh foods.  Eat more vegetables, fruits, and low-fat dairy products.  Choose whole grains. Look for the word "whole" as the first word in the ingredient list.  Choose fish and skinless chicken or [place] more often than red meat. Limit fish, poultry, and meat to 6 oz (170 g) each day.  Limit sweets, desserts, sugars, and sugary drinks.  Choose heart-healthy fats.  Limit cheese to 1 oz (28 g) per day.  Eat more home-cooked food and less restaurant, buffet, and fast food.  Limit fried foods.  Cook foods using methods other than frying.  Limit canned vegetables. If you do use them, rinse them well to decrease the sodium.  When eating at a restaurant, ask that your food be prepared with less salt, or no salt if possible. WHAT FOODS CAN I EAT? Seek help from a dietitian for individual calorie needs. Grains Whole grain or whole wheat bread. Brown rice. Whole grain or whole wheat pasta. Quinoa, bulgur, and whole grain cereals. Low-sodium  cereals. Corn or whole wheat flour tortillas. Whole grain cornbread. Whole grain crackers. Low-sodium crackers. Vegetables Fresh or frozen vegetables (raw, steamed, roasted, or grilled). Low-sodium or reduced-sodium tomato and vegetable juices. Low-sodium or reduced-sodium tomato sauce and paste. Low-sodium or reduced-sodium canned vegetables.  Fruits All fresh, canned (in natural juice), or frozen fruits. Meat and Other  Protein Products Ground beef (85% or leaner), grass-fed beef, or beef trimmed of fat. Skinless chicken or [place]. Ground chicken or [place]. Pork trimmed of fat. All fish and seafood. Eggs. Dried beans, peas, or lentils. Unsalted nuts and seeds. Unsalted canned beans. Dairy Low-fat dairy products, such as skim or 1% milk, 2% or reduced-fat cheeses, low-fat ricotta or cottage cheese, or plain low-fat yogurt. Low-sodium or reduced-sodium cheeses. Fats and Oils Tub margarines without trans fats. Light or reduced-fat mayonnaise and salad dressings (reduced sodium). Avocado. Safflower, olive, or canola oils. Natural peanut or almond butter. Other Unsalted popcorn and pretzels. The items listed above may not be a complete list of recommended foods or beverages. Contact your dietitian for more options. WHAT FOODS ARE NOT RECOMMENDED? Grains White bread. White pasta. White rice. Refined cornbread. Bagels and croissants. Crackers that contain trans fat. Vegetables Creamed or fried vegetables. Vegetables in a cheese sauce. Regular canned vegetables. Regular canned tomato sauce and paste. Regular tomato and vegetable juices. Fruits Dried fruits. Canned fruit in light or heavy syrup. Fruit juice. Meat and Other Protein Products Fatty cuts of meat. Ribs, chicken wings, bacon, sausage, bologna, salami, chitterlings, fatback, hot dogs, bratwurst, and packaged luncheon meats. Salted nuts and seeds. Canned beans with salt. Dairy Whole or 2% milk, cream, half-and-half, and cream cheese. Whole-fat or sweetened yogurt. Full-fat cheeses or blue cheese. Nondairy creamers and whipped toppings. Processed cheese, cheese spreads, or cheese curds. Condiments Onion and garlic salt, seasoned salt, table salt, and sea salt. Canned and packaged gravies. Worcestershire sauce. Tartar sauce. Barbecue sauce. Teriyaki sauce. Soy sauce, including reduced sodium. Steak sauce. Fish sauce. Oyster sauce. Cocktail sauce. Horseradish.  Ketchup and mustard. Meat flavorings and tenderizers. Bouillon cubes. Hot sauce. Tabasco sauce. Marinades. Taco seasonings. Relishes. Fats and Oils Butter, stick margarine, lard, shortening, ghee, and bacon fat. Coconut, palm kernel, or palm oils. Regular salad dressings. Other Pickles and olives. Salted popcorn and pretzels. The items listed above may not be a complete list of foods and beverages to avoid. Contact your dietitian for more information. WHERE CAN I FIND MORE INFORMATION? National Heart, Lung, and Blood Institute: travelstabloid.com Document Released: 08/31/2011 Document Revised: 01/26/2014 Document Reviewed: 07/16/2013 [facility] Patient Information 2015 [place], [place]. This information is not intended to replace advice given to you by your health care provider. Make sure you discuss any questions you have with your health care provider.  Weakness Weakness is a lack of strength. You may feel weak all over your body or just in one part of your body. Weakness can be serious. In some cases, you may need more medical tests. HOME [place] a well-balanced diet.  Try to exercise every day.  Only take medicines as told by your doctor. GET HELP RIGHT AWAY IF:   You cannot do your normal daily activities.  You cannot walk up and down stairs, or you feel very tired when you do so.  You have shortness of breath or chest pain.  You have trouble moving parts of your body.  You have weakness in only one body part or on only one side of the body.  You have a  fever.  You have trouble speaking or swallowing.  You cannot control when you pee (urinate) or poop (bowel movement).  You have black or bloody throw up (vomit) or poop.  Your weakness gets worse or spreads to other body parts.  You have new aches or pains. MAKE SURE YOU:   Understand these instructions.  Will watch your condition.  Will get help right away if you are not  doing well or get worse. Document Released: 08/24/2008 Document Revised: 03/12/2012 Document Reviewed: 11/10/2011 [facility] Patient Information 2015 [place], [place]. This information is not intended to replace advice given to you by your health care provider. Make sure you discuss any questions you have with your health care provider.

## 2015-06-24 NOTE — ED Provider Notes (Signed)
Patient presented to the ER with generalized weakness.  Face to face Exam: HEENT - PERRLA Lungs - CTAB Heart - RRR, no M/R/G Abd - S/NT/ND Neuro - alert, oriented x3  Plan: Elderly patient with generalized weakness. No focal symptoms. Patient hypertensive here in the ER will require additional treatment. Labs and urine to workup her weakness.  Orpah Greek, MD 06/24/15 1227

## 2015-06-24 NOTE — Progress Notes (Signed)
Received report from [place] in ED for transfer of pt to 5[address] 33.

## 2015-06-24 NOTE — ED Provider Notes (Signed)
Care assumed from Dorothy Carnes PA-C at shift change. Dorothy Noble here with generalized weakness, no focal deficits on exam, work up thus far reveals mildly elevated BUN/Cr and K of 5.6 but on a hemolyzed sample, U/A clear. CXR with mild CHF, CT head without acute changes. EKG unremarkable. Repeat potassium pending. Dorothy Noble also noted to be hypertensive in the 200s/60s, hydralazine just given. Plan is to await repeat potassium, and see if BP decreases with hydralazine, then ambulate and d/c home.  Physical Exam  BP 214/62 mmHg  Pulse 60  Temp(Src) 97.4 F (36.3 C) (Oral)  Resp 24  SpO2 100%  Physical Exam Gen: afebrile, VSS (BP on my exam 152/64), NAD HEENT: EOMI, MMM Resp: CTAB in all lung fields, no w/r/r, no hypoxia or increased WOB, speaking in full sentences, SpO2 100% on RA  CV: RRR, nl s1/s2, no m/r/g Abd: appearance normal, soft NTND, no r/g/r, +BS throughout MsK: moving all extremities with ease, strength and sensation grossly intact Neuro: A&O x4   ED Course  Procedures Results for orders placed or performed during the hospital encounter of 06/24/15  CBC with Differential  Result Value Ref Range   WBC 5.2 4.0 - 10.5 K/uL   RBC 5.09 3.87 - 5.11 MIL/uL   Hemoglobin 16.0 (H) 12.0 - 15.0 g/dL   HCT 47.9 (H) 36.0 - 46.0 %   MCV 94.1 78.0 - 100.0 fL   MCH 31.4 26.0 - 34.0 pg   MCHC 33.4 30.0 - 36.0 g/dL   RDW 13.3 11.5 - 15.5 %   Platelets 177 150 - 400 K/uL   Neutrophils Relative % 78 %   Neutro Abs 4.1 1.7 - 7.7 K/uL   Lymphocytes Relative 18 %   Lymphs Abs 0.9 0.7 - 4.0 K/uL   Monocytes Relative 4 %   Monocytes Absolute 0.2 0.1 - 1.0 K/uL   Eosinophils Relative 0 %   Eosinophils Absolute 0.0 0.0 - 0.7 K/uL   Basophils Relative 0 %   Basophils Absolute 0.0 0.0 - 0.1 K/uL  Basic metabolic panel  Result Value Ref Range   Sodium 139 135 - 145 mmol/L   Potassium 5.6 (H) 3.5 - 5.1 mmol/L   Chloride 103 101 - 111 mmol/L   CO2 24 22 - 32 mmol/L   Glucose, Bld 121 (H) 65 - 99 mg/dL    BUN 28 (H) 6 - 20 mg/dL   Creatinine, Ser 1.24 (H) 0.44 - 1.00 mg/dL   Calcium 10.4 (H) 8.9 - 10.3 mg/dL   GFR calc non Af Amer 37 (L) >60 mL/min   GFR calc Af Amer 43 (L) >60 mL/min   Anion gap 12 5 - 15  Urinalysis, Routine w reflex microscopic (not at [facility] [address] [address])  Result Value Ref Range   Color, Urine YELLOW YELLOW   APPearance CLEAR CLEAR   Specific Gravity, Urine 1.011 1.005 - 1.030   pH 7.5 5.0 - 8.0   Glucose, UA 100 (A) NEGATIVE mg/dL   Hgb urine dipstick SMALL (A) NEGATIVE   Bilirubin Urine NEGATIVE NEGATIVE   Ketones, ur 15 (A) NEGATIVE mg/dL   Protein, ur 100 (A) NEGATIVE mg/dL   Urobilinogen, UA 0.2 0.0 - 1.0 mg/dL   Nitrite NEGATIVE NEGATIVE   Leukocytes, UA NEGATIVE NEGATIVE  Urine microscopic-add on  Result Value Ref Range   Squamous Epithelial / LPF RARE RARE   RBC / HPF 0-2 <3 RBC/hpf  Potassium  Result Value Ref Range   Potassium 4.6 3.5 - 5.1 mmol/L  I-stat troponin,  ED  Result Value Ref Range   Troponin i, poc 0.01 0.00 - 0.08 ng/mL   Comment 3           Dg Chest 2 View  06/24/2015   CLINICAL DATA:  Chest pain. Weakness. Initial encounter. RIGHT arm numbness and tingling for several months.  EXAM: CHEST  2 VIEW  COMPARISON:  05/11/2009.  FINDINGS: Progressive enlargement of the cardiopericardial silhouette compatible with CHF. Mild pulmonary vascular congestion. Old granulomatous disease with calcified RIGHT hilar lymph nodes. Partially radiopaque monitoring weights project over the chest. Aortic arch atherosclerosis. Blunting of the RIGHT costophrenic angle suggesting a small pleural effusion. There may be a small LEFT pleural effusion is well.  IMPRESSION: Mild CHF.   Electronically Signed   By: Dereck Ligas M.D.   On: 06/24/2015 11:59   Ct Head Wo Contrast  06/24/2015   CLINICAL DATA:  Weakness since the night of 06/23/2015. Initial encounter.  EXAM: CT HEAD WITHOUT CONTRAST  TECHNIQUE: Contiguous axial images were obtained from the base of the skull through  the vertex without intravenous contrast.  COMPARISON:  None.  FINDINGS: The patient has remote right parietal infarct. There is chronic microvascular ischemic change. No evidence of acute intracranial abnormality including hemorrhage, acute infarction, mass lesion, mass effect, midline shift or abnormal extra-axial fluid collection is identified. No hydrocephalus or pneumocephalus. The calvarium is intact. Imaged paranasal sinuses and mastoid air cells are clear. Atherosclerotic vascular disease noted.  IMPRESSION: No acute abnormality.  Atrophy, chronic microvascular ischemic change and remote right parietal infarct.   Electronically Signed   By: Inge Rise M.D.   On: 06/24/2015 11:38   EKG Interpretation  Date/Time:  Thursday June 24 2015 11:02:01 EDT Ventricular Rate:  59 PR Interval:  146 QRS Duration: 85 QT Interval:  446 QTC Calculation: 442 R Axis:   63 Text Interpretation:  Sinus rhythm Borderline repolarization abnormality Artifact in lead(s) I III aVR aVL aVF Confirmed by POLLINA  MD, CHRISTOPHER [phone]) on 06/24/2015 3:15:36 PM   Meds ordered this encounter  Medications  . hydrALAZINE (APRESOLINE) injection 10 mg    Sig:      MDM:   ICD-9-CM ICD-10-CM   1. Generalized weakness 780.79 R53.1   2. Essential hypertension 401.9 I10   3. Gait instability 781.2 R26.81    3:29 PM BP 152/64. Dorothy Noble hungry, will feed and then ambulate with walker. Repeat K 4.6.  5:54 PM Dorothy Noble attempted to ambulate and was unable to even get up with walker. Family concerned that she can't go home this way. Stated that previous to this she was independently walking. Given that we can't get her to ambulate here, despite no obvious etiology, will proceed with admission.   6:15 PM Dr. Eulas Post returning page, will admit. Please see her notes for further documentation of care.  BP 154/52 mmHg  Pulse 63  Temp(Src) 97.4 F (36.3 C) (Oral)  Resp 20  SpO2 99%    Muriel Hannold Camprubi-Soms,  PA-C 06/24/15 1815  Orpah Greek, MD 06/25/15 1614

## 2015-06-24 NOTE — H&P (Signed)
[facility] History and Physical  [name] ZJQ:[id number] DOB: 1922/07/20 DOA: 06/24/2015  PCP: Osborne Casco, MD   Chief Complaint: Weakness and urinary incontinence  HPI: Dorothy Noble Noble is a 81 y.o. woman with a history of CVA after undergoing Right carotid endarterectomy and chronic right arm parasthesias who feels that she was in her baseline state of health until last night.  She developed urinary incontinence, which is atypical for her, and increased weakness.  She woke up this morning to find that she had wet her bed.  She had a single episode of nausea and nonbloody emesis.  She had light-headedness but no syncope.  No falls or head trauma.  She denies dysuria.  One of her daughters came over and observed that her mother seemed weaker than normal and was unsteady on her feet (she is supposed to use a walker at baseline).  She called 911.    Tonight, she is accompanied by three daughters and twin grandsons.  Her daughters all agree that her PO intake has been inadequate.  She has lost enough weight to go from a size 12 to a size 8 over the past year.  Her PCP is aware and has recommended nutritional supplements in the past.  The patient was found to be hypertensive upon arrival to the ED.  Her blood pressure improved after IV hydralazine.  She reported mild dizziness with sitting up in bed.  Evaluation in the ED has revealed elevated BUN and creatinine levels with ketones in her urine, suggestive of dehydration.  No acute findings on head CT.  Chest xray shows cardiomegaly and possible CHF.  The family is concerned about patient's safety at home.  She is being admitted for observation.  Review of Systems: One episode of fleeting chest pain, possibly muscular, after over exertion earlier this week.  It has not recurred.  Otherwise, 12 systems reviewed and negative except as stated in HPI.  Past Medical History  Diagnosis Date  . Hypertension   . Arthritis   . Herniated disc    . Stroke   . Carotid artery occlusion    Past Surgical History  Procedure Laterality Date  . Carotid endarderectomy    . Blood clot removed from r ankle    . Carotid endarterectomy Right 05-14-09    cea   Social History:  Social History   Social History Narrative  No tobacco, EtOH, or illicit drug use.  She is a widow.  She still lives independently.  Should be using a cane with ambulation at baseline.  No Known Allergies  Family History  Problem Relation Age of Onset  . Hyperlipidemia Daughter   . Hypertension Daughter   . Other Daughter     varicose veins  . Varicose Veins Daughter   . Cancer Sister     Breast  . Hyperlipidemia Sister   . Hypertension Sister   . Hypertension Daughter   . Hyperlipidemia Daughter    Prior to Admission medications   Medication Sig Start Date End Date Taking? Authorizing Provider  aspirin EC 81 MG tablet Take 81 mg by mouth daily.     Yes Historical Provider, MD  atenolol (TENORMIN) 50 MG tablet Take 50 mg by mouth daily. 02/15/15  Yes Historical Provider, MD  BESIVANCE 0.6 % SUSP Place 1 drop into the left eye 4 (four) times daily. 2 days after each monthly eye injection 06/17/15  Yes Historical Provider, MD  doxycycline (VIBRAMYCIN) 100 MG capsule Take 1 capsule by mouth every  12 (twelve) hours. 06/15/15  Yes Historical Provider, MD  Multiple Vitamins-Minerals (MULTIVITAMINS THER. W/MINERALS) TABS Take 1 tablet by mouth daily.     Yes Historical Provider, MD  Omega-3 Fatty Acids (FISH OIL) 1000 MG CAPS Take by mouth 3 (three) times daily.   Yes Historical Provider, MD  Red Yeast Rice Extract (RED YEAST RICE PO) Take 2 tablets by mouth at bedtime.   Yes Historical Provider, MD   Physical Exam: Filed Vitals:   06/24/15 1800 06/24/15 1815 06/24/15 1830 06/24/15 1845  BP: 161/85 94/68 122/80 136/67  Pulse: 64 59 25   Temp:      TempSrc:      Resp: 18 15 16 15   SpO2: 98% 98% 99%      General:  Awake and alert, NAD  Eyes: PERRL  bilaterally  ENT: No nasal drainage  Neck: Supple, no carotid bruit  Cardiovascular: irregular but normal rate  Respiratory: CTA bilaterally  Abdomen: soft, nontender, nondistended, no guarding, bowel sounds are hypoactive  Skin: Warm and dry,  Legs are dressed and I did not remove  Musculoskeletal: Moves all 4 extremities spontaneously, normal range of motion  Psychiatric: affect appropriate  Neurologic: no focal deficits, CN grossly intact, coordination intact, strength symmetric bilaterally  Labs on Admission:  Basic Metabolic Panel:  Recent Labs Lab 06/24/15 1235 06/24/15 1449  NA 139  --   K 5.6* 4.6  CL 103  --   CO2 24  --   GLUCOSE 121*  --   BUN 28*  --   CREATININE 1.24*  --   CALCIUM 10.4*  --    CBC:  Recent Labs Lab 06/24/15 1235  WBC 5.2  NEUTROABS 4.1  HGB 16.0*  HCT 47.9*  MCV 94.1  PLT 177   Cardiac Enzymes: Negative troponin in the ED  Radiological Exams on Admission: Dg Chest 2 View  06/24/2015   CLINICAL DATA:  Chest pain. Weakness. Initial encounter. RIGHT arm numbness and tingling for several months.  EXAM: CHEST  2 VIEW  COMPARISON:  05/11/2009.  FINDINGS: Progressive enlargement of the cardiopericardial silhouette compatible with CHF. Mild pulmonary vascular congestion. Old granulomatous disease with calcified RIGHT hilar lymph nodes. Partially radiopaque monitoring weights project over the chest. Aortic arch atherosclerosis. Blunting of the RIGHT costophrenic angle suggesting a small pleural effusion. There may be a small LEFT pleural effusion is well.  IMPRESSION: Mild CHF.   Electronically Signed   By: Dereck Ligas M.D.   On: 06/24/2015 11:59   Ct Head Wo Contrast  06/24/2015   CLINICAL DATA:  Weakness since the night of 06/23/2015. Initial encounter.  EXAM: CT HEAD WITHOUT CONTRAST  TECHNIQUE: Contiguous axial images were obtained from the base of the skull through the vertex without intravenous contrast.  COMPARISON:  None.   FINDINGS: The patient has remote right parietal infarct. There is chronic microvascular ischemic change. No evidence of acute intracranial abnormality including hemorrhage, acute infarction, mass lesion, mass effect, midline shift or abnormal extra-axial fluid collection is identified. No hydrocephalus or pneumocephalus. The calvarium is intact. Imaged paranasal sinuses and mastoid air cells are clear. Atherosclerotic vascular disease noted.  IMPRESSION: No acute abnormality.  Atrophy, chronic microvascular ischemic change and remote right parietal infarct.   Electronically Signed   By: Inge Rise M.D.   On: 06/24/2015 11:38    EKG: Independently reviewed. Initial EKG NSR.  Repeat pending.  Assessment/Plan Active Problems:   Weakness   1. Admit for observation, telemetry  2.  Mild  AKI secondary to dehydration --Gentle IV fluids, NS at 100 cc/hr --Repeat BMP in AM --U/A is not consistent with UTI  3.  Weakness and dizziness --Monitor for arrhythmia on telemetry --Repeat EKG in AM --Document orthostatics qShift; expect hydration to help --PT eval and treat --Check cardiac enzymes, Thyroid function tests, hepatic function panel --Cardiomegaly noted on chest xray; question of vascular congestion.  Clinically, she does not appear volume overloaded or to be in decompensated heart failure.  Check BNP with AM labs.  Can consider echo (inpatient vs outpatient).  4.  Accelerated HTN --Continue home dose of atenolol --IV labetalol prn  5.  History of cellulitis, prior to admission --Continue doxycycline  6.  Weight loss --Recommended that family consider a trial of an appetite stimulant, such as megace  Case management consult for discharge planning (home health)  Code Status: FULL Family Communication: Three daughters at bedside Disposition Plan: To be determined based on patient's response to initial therapy  Time spent: 70 minutes  [employer] [facility]  06/24/2015, 7:20 PM

## 2015-06-24 NOTE — ED Notes (Signed)
hospitalist at the bedside 

## 2015-06-25 ENCOUNTER — Observation Stay (HOSPITAL_BASED_OUTPATIENT_CLINIC_OR_DEPARTMENT_OTHER): Payer: Medicare Other

## 2015-06-25 DIAGNOSIS — R2681 Unsteadiness on feet: Secondary | ICD-10-CM | POA: Diagnosis not present

## 2015-06-25 DIAGNOSIS — Z8673 Personal history of transient ischemic attack (TIA), and cerebral infarction without residual deficits: Secondary | ICD-10-CM | POA: Diagnosis not present

## 2015-06-25 DIAGNOSIS — E440 Moderate protein-calorie malnutrition: Secondary | ICD-10-CM | POA: Diagnosis not present

## 2015-06-25 DIAGNOSIS — R0602 Shortness of breath: Secondary | ICD-10-CM | POA: Diagnosis not present

## 2015-06-25 DIAGNOSIS — R001 Bradycardia, unspecified: Secondary | ICD-10-CM | POA: Diagnosis not present

## 2015-06-25 DIAGNOSIS — R531 Weakness: Secondary | ICD-10-CM

## 2015-06-25 DIAGNOSIS — I10 Essential (primary) hypertension: Secondary | ICD-10-CM | POA: Diagnosis not present

## 2015-06-25 LAB — BASIC METABOLIC PANEL
ANION GAP: 9 (ref 5–15)
BUN: 30 mg/dL — ABNORMAL HIGH (ref 6–20)
CALCIUM: 9.9 mg/dL (ref 8.9–10.3)
CHLORIDE: 104 mmol/L (ref 101–111)
CO2: 28 mmol/L (ref 22–32)
CREATININE: 1.49 mg/dL — AB (ref 0.44–1.00)
GFR calc non Af Amer: 29 mL/min — ABNORMAL LOW (ref >60)
GFR, EST AFRICAN AMERICAN: 34 mL/min — AB (ref >60)
GLUCOSE: 95 mg/dL (ref 65–99)
Potassium: 4.1 mmol/L (ref 3.5–5.1)
Sodium: 141 mmol/L (ref 135–145)

## 2015-06-25 LAB — CBC
HEMATOCRIT: 44.3 % (ref 36.0–46.0)
HEMOGLOBIN: 14.7 g/dL (ref 12.0–15.0)
MCH: 31.3 pg (ref 26.0–34.0)
MCHC: 33.2 g/dL (ref 30.0–36.0)
MCV: 94.5 fL (ref 78.0–100.0)
Platelets: 189 10*3/uL (ref 150–400)
RBC: 4.69 MIL/uL (ref 3.87–5.11)
RDW: 13.7 % (ref 11.5–15.5)
WBC: 6.2 10*3/uL (ref 4.0–10.5)

## 2015-06-25 LAB — T4, FREE: FREE T4: 1.1 ng/dL (ref 0.61–1.12)

## 2015-06-25 LAB — CK TOTAL AND CKMB (NOT AT ARMC)
CK, MB: 3.3 ng/mL (ref 0.5–5.0)
RELATIVE INDEX: INVALID (ref 0.0–2.5)
Total CK: 62 U/L (ref 38–234)

## 2015-06-25 LAB — BRAIN NATRIURETIC PEPTIDE: B NATRIURETIC PEPTIDE 5: 977.4 pg/mL — AB (ref 0.0–100.0)

## 2015-06-25 LAB — TSH: TSH: 5.634 u[IU]/mL — AB (ref 0.350–4.500)

## 2015-06-25 LAB — TROPONIN I: Troponin I: 0.03 ng/mL (ref <0.031)

## 2015-06-25 MED ORDER — HYDROCERIN EX CREA
TOPICAL_CREAM | CUTANEOUS | Status: DC
  Administered 2015-06-25: 10:00:00 via TOPICAL
  Filled 2015-06-25: qty 113

## 2015-06-25 MED ORDER — HYDRALAZINE HCL 20 MG/ML IJ SOLN: 5.0000 mg | Freq: Four times a day (QID) | INTRAMUSCULAR | Status: DC | PRN

## 2015-06-25 MED ORDER — ENSURE ENLIVE PO LIQD
237.0000 mL | Freq: Three times a day (TID) | ORAL | Status: DC
  Administered 2015-06-25 – 2015-06-26 (×4): 237 mL via ORAL

## 2015-06-25 MED ORDER — HYDRALAZINE HCL 25 MG PO TABS
25.0000 mg | ORAL_TABLET | Freq: Three times a day (TID) | ORAL | Status: DC
  Administered 2015-06-25 – 2015-06-26 (×3): 25 mg via ORAL
  Filled 2015-06-25 (×4): qty 1

## 2015-06-25 NOTE — Progress Notes (Signed)
Initial Nutrition Assessment  DOCUMENTATION CODES:   Non-severe (moderate) malnutrition in context of chronic illness  INTERVENTION:   Ensure Enlive po BID, each supplement provides 350 kcal and 20 grams of protein  NUTRITION DIAGNOSIS:   Malnutrition related to chronic illness as evidenced by mild depletion of body fat, mild depletion of muscle mass.  GOAL:   Patient will meet greater than or equal to 90% of their needs  MONITOR:   PO intake, Supplement acceptance, Labs, Weight trends, Skin, I & O's  REASON FOR ASSESSMENT:   Malnutrition Screening Tool    ASSESSMENT:   Dorothy Noble is a 81 y.o. woman with a history of CVA after undergoing Right carotid endarterectomy and chronic right arm parasthesias who feels that she was in her baseline state of health until last night. She developed urinary incontinence, which is atypical for her, and increased weakness. She woke up this morning to find that she had wet her bed. She had a single episode of nausea and nonbloody emesis. She had light-headedness but no syncope. No falls or head trauma. She denies dysuria. One of her daughters came over and observed that her mother seemed weaker than normal and was unsteady on her feet (she is supposed to use a walker at baseline). She called 911.   Pt admitted for weakness. She [facility] a senior [facility].   Spoke with pt at bedside, who endorses a decreased appetite and weight loss, however, is unable to elaborate further. She reports she has gone from a size 12 to a size 8 in clothing. She tolerated her breakfast well and reports "it was delicious". She consumed 50% of her eggs, all of her oatmeal, and all of her [place] sausage. She denies any difficulty chewing or swallowing foods.   Reviewed [facility] note. Pt with venous dermatitis on bilateral lower extremities.  Nutrition-Focused physical exam completed. Findings are mild to moderate fat depletion, mild to moderate  muscle depletion, and mild edema.   Pt amenable to try Ensure Enlive nutritional needs. Discussed importance of good meal and supplement intake to support healing. Pt expressed appreciation for visit.   Labs reviewed.   Diet Order:  Diet Heart Room service appropriate?: Yes; Fluid consistency:: Thin  Skin:  Reviewed, no issues (bilateral venous dermatitis on lt lower legs)  Last BM:  06/24/15  Height:   Ht Readings from Last 1 Encounters:  06/24/15 5\' 2"  (1.575 m)    Weight:   Wt Readings from Last 1 Encounters:  06/24/15 122 lb 6.4 oz (55.52 kg)    Ideal Body Weight:  50 kg  BMI:  Body mass index is 22.38 kg/(m^2).  Estimated Nutritional Needs:   Kcal:  1400-1600  Protein:  65-80 grams  Fluid:  1.4-1.6 L  EDUCATION NEEDS:   Education needs addressed  Jenifer A. Jimmye Norman, RD, LDN, CDE Pager: [phone] After hours Pager: [phone]

## 2015-06-25 NOTE — Progress Notes (Signed)
Patient Demographics  Dorothy Noble, is a 81 y.o. female, DOB - [birth year], XYI:[id number]  Admit date - 06/24/2015   Admitting Physician Lily Kocher, MD  Outpatient Primary MD for the patient is Osborne Casco, MD  LOS -    Chief Complaint  Patient presents with  . Weakness         Subjective:   Dorothy Noble today has, No headache, No chest pain, No abdominal pain - No Nausea, No Cough - SOB. Reports she is already feeling better.  Assessment & Plan    Active Problems:   Weakness  Generalized weakness/dizziness - This is most likely related to dehydration, continue with IV fluid. - Negative urinalysis, no evidence of infectious process. - Pending PT evaluation  - Tele significant for second-degree AV block Mobitz 2  Acute renal failure - Continue with IV fluids  Bradycardia - Telemetry monitoring significant for second-degree AV block Mobitz 2, stop beta blocker, 2-D echo pending, cardio consulted.  Hypertension - Atenolol has been held for bradycardia, noted on low-dose hydralazine  Protein calorie malnutrition - started on ensure, will start on Megace  History of severe colitis prior to To admission - Continue doxycycline     Code Status: Full  Family Communication: none at bedside.  Disposition Plan: Pending further workup   Procedures  None   Consults   Cardiology   Medications  Scheduled Meds: . aspirin EC  81 mg Oral Daily  . atenolol  50 mg Oral Daily  . doxycycline  100 mg Oral Q12H  . enoxaparin (LOVENOX) injection  30 mg Subcutaneous Q24H  . feeding supplement (ENSURE ENLIVE)  237 mL Oral TID BM  . hydrocerin   Topical Once per day on Tue Fri  . multivitamin with minerals  1 tablet Oral Daily  . sodium chloride  3 mL Intravenous Q12H   Continuous Infusions: . sodium chloride 100 mL/hr at 06/25/15 0331   PRN Meds:.acetaminophen **OR**  acetaminophen, labetalol, ondansetron **OR** ondansetron (ZOFRAN) IV  DVT Prophylaxis  Lovenox -  Lab Results  Component Value Date   PLT 189 06/25/2015    Antibiotics    Anti-infectives    Start     Dose/Rate Route Frequency Ordered Stop   06/24/15 2200  doxycycline (VIBRA-TABS) tablet 100 mg     100 mg Oral Every 12 hours 06/24/15 2026            Objective:   Filed Vitals:   06/24/15 2035 06/24/15 2150 06/25/15 0632 06/25/15 1001  BP: 148/66 161/48 110/74 132/40  Pulse: 34 66 41 51  Temp:   97.6 F (36.4 C)   TempSrc:   Oral   Resp: 18 18 18    Height: 5\' 2"  (1.575 m)     Weight: 55.52 kg (122 lb 6.4 oz)     SpO2: 100% 100% 98%     Wt Readings from Last 3 Encounters:  06/24/15 55.52 kg (122 lb 6.4 oz)  02/25/15 58.514 kg (129 lb)  01/22/14 62.143 kg (137 lb)     Intake/Output Summary (Last 24 hours) at 06/25/15 1210 Last data filed at 06/25/15 0900  Gross per 24 hour  Intake    120 ml  Output    350 ml  Net   -  230 ml     Physical Exam  Awake Alert,  [place].AT,PERRAL Supple Neck,No JVD, No cervical lymphadenopathy appriciated.  Symmetrical Chest wall movement, Good air movement bilaterally,  brady,No Gallops,Rubs or new Murmurs, No Parasternal Heave +ve B.Sounds, Abd Soft, No tenderness, No organomegaly appriciated, No rebound - guarding or rigidity. No Cyanosis, Clubbing or edema, No new Rash or bruise    Data Review   Micro Results No results found for this or any previous visit (from the past 240 hour(s)).  Radiology Reports Dg Chest 2 View  06/24/2015   CLINICAL DATA:  Chest pain. Weakness. Initial encounter. RIGHT arm numbness and tingling for several months.  EXAM: CHEST  2 VIEW  COMPARISON:  05/11/2009.  FINDINGS: Progressive enlargement of the cardiopericardial silhouette compatible with CHF. Mild pulmonary vascular congestion. Old granulomatous disease with calcified RIGHT hilar lymph nodes. Partially radiopaque monitoring weights project over  the chest. Aortic arch atherosclerosis. Blunting of the RIGHT costophrenic angle suggesting a small pleural effusion. There may be a small LEFT pleural effusion is well.  IMPRESSION: Mild CHF.   Electronically Signed   By: Dereck Ligas M.D.   On: 06/24/2015 11:59   Ct Head Wo Contrast  06/24/2015   CLINICAL DATA:  Weakness since the night of 06/23/2015. Initial encounter.  EXAM: CT HEAD WITHOUT CONTRAST  TECHNIQUE: Contiguous axial images were obtained from the base of the skull through the vertex without intravenous contrast.  COMPARISON:  None.  FINDINGS: The patient has remote right parietal infarct. There is chronic microvascular ischemic change. No evidence of acute intracranial abnormality including hemorrhage, acute infarction, mass lesion, mass effect, midline shift or abnormal extra-axial fluid collection is identified. No hydrocephalus or pneumocephalus. The calvarium is intact. Imaged paranasal sinuses and mastoid air cells are clear. Atherosclerotic vascular disease noted.  IMPRESSION: No acute abnormality.  Atrophy, chronic microvascular ischemic change and remote right parietal infarct.   Electronically Signed   By: Inge Rise M.D.   On: 06/24/2015 11:38     CBC  Recent Labs Lab 06/24/15 1235 06/24/15 2110 06/25/15 0245  WBC 5.2 6.5 6.2  HGB 16.0* 14.4 14.7  HCT 47.9* 42.7 44.3  PLT 177 178 189  MCV 94.1 94.1 94.5  MCH 31.4 31.7 31.3  MCHC 33.4 33.7 33.2  RDW 13.3 13.6 13.7  LYMPHSABS 0.9  --   --   MONOABS 0.2  --   --   EOSABS 0.0  --   --   BASOSABS 0.0  --   --     Chemistries   Recent Labs Lab 06/24/15 1235 06/24/15 1449 06/24/15 2103 06/24/15 2110 06/25/15 0245  NA 139  --   --   --  141  K 5.6* 4.6  --   --  4.1  CL 103  --   --   --  104  CO2 24  --   --   --  28  GLUCOSE 121*  --   --   --  95  BUN 28*  --   --   --  30*  CREATININE 1.24*  --   --  1.37* 1.49*  CALCIUM 10.4*  --   --   --  9.9  AST  --   --  40  --   --   ALT  --   --  32   --   --   ALKPHOS  --   --  104  --   --   BILITOT  --   --  0.7  --   --    ------------------------------------------------------------------------------------------------------------------ estimated creatinine clearance is 19.5 mL/min (by C-G formula based on Cr of 1.49). ------------------------------------------------------------------------------------------------------------------ No results for input(s): HGBA1C in the last 72 hours. ------------------------------------------------------------------------------------------------------------------ No results for input(s): CHOL, HDL, LDLCALC, TRIG, CHOLHDL, LDLDIRECT in the last 72 hours. ------------------------------------------------------------------------------------------------------------------  Recent Labs  06/25/15 0245  TSH 5.634*   ------------------------------------------------------------------------------------------------------------------ No results for input(s): VITAMINB12, FOLATE, FERRITIN, TIBC, IRON, RETICCTPCT in the last 72 hours.  Coagulation profile No results for input(s): INR, PROTIME in the last 168 hours.  No results for input(s): DDIMER in the last 72 hours.  Cardiac Enzymes  Recent Labs Lab 06/24/15 2110 06/25/15 0245  CKMB 3.3 3.3  TROPONINI <0.03 0.03   ------------------------------------------------------------------------------------------------------------------ Invalid input(s): POCBNP     Time Spent in minutes   30 minutes   ELGERGAWY, DAWOOD M.D on 06/25/2015 at 12:10 PM  Between 7am to 7pm - Pager - [phone]  After 7pm go to [email or web address] - password [facility]  [facility]   Office  [phone]

## 2015-06-25 NOTE — Progress Notes (Signed)
  Echocardiogram 2D Echocardiogram has been performed.  Donata Clay 06/25/2015, 1:56 PM

## 2015-06-25 NOTE — Consult Note (Signed)
ELECTROPHYSIOLOGY CONSULT NOTE    Patient ID: Dorothy Noble MRN: [id number], DOB/AGE: 1922/08/03 81 y.o.  Admit date: 06/24/2015 Date of Consult: 06/25/2015  Primary Physician: Osborne Casco, MD Primary Cardiologist: new to [facility]  Reason for Consultation: weakness and bradycardia  HPI:  Dorothy Noble is a 81 y.o. female with a past medical history significant for hypertension, arthritis, prior CVA.  She was admitted yesterday with weakness and urinary incontinence. Telemetry demonstrated bradycardia and EP has been asked to evaluate for treatment options.  She has not had prior cardiac evaluation.  Echo is pending this admission  She currently reports feeling much better. She has not had chest pain or shortness of breath. She did have some dizziness the day of admission but none since arrival at hospital. She was on Atenolol at home which has been held with improvement in sinus rates.   Past Medical History  Diagnosis Date  . Hypertension   . Arthritis   . Herniated disc   . Stroke   . Carotid artery occlusion      Surgical History:  Past Surgical History  Procedure Laterality Date  . Carotid endarderectomy    . Blood clot removed from r ankle    . Carotid endarterectomy Right 05-14-09    cea     Prescriptions prior to admission  Medication Sig Dispense Refill Last Dose  . aspirin EC 81 MG tablet Take 81 mg by mouth daily.     06/23/2015 at Unknown time  . atenolol (TENORMIN) 50 MG tablet Take 50 mg by mouth daily.  1 06/23/2015 at Unknown time  . BESIVANCE 0.6 % SUSP Place 1 drop into the left eye 4 (four) times daily. 2 days after each monthly eye injection  12 06/19/2015  . doxycycline (VIBRAMYCIN) 100 MG capsule Take 1 capsule by mouth every 12 (twelve) hours.  0 06/23/2015 at Unknown time  . Multiple Vitamins-Minerals (MULTIVITAMINS THER. W/MINERALS) TABS Take 1 tablet by mouth daily.     06/23/2015 at Unknown time  . Omega-3 Fatty Acids (FISH OIL) 1000 MG  CAPS Take by mouth 3 (three) times daily.   06/23/2015 at Unknown time  . Red Yeast Rice Extract (RED YEAST RICE PO) Take 2 tablets by mouth at bedtime.   06/23/2015 at Unknown time    Inpatient Medications:  . aspirin EC  81 mg Oral Daily  . doxycycline  100 mg Oral Q12H  . enoxaparin (LOVENOX) injection  30 mg Subcutaneous Q24H  . feeding supplement (ENSURE ENLIVE)  237 mL Oral TID BM  . hydrALAZINE  25 mg Oral 3 times per day  . hydrocerin   Topical Once per day on Tue Fri  . multivitamin with minerals  1 tablet Oral Daily  . sodium chloride  3 mL Intravenous Q12H    Allergies: No Known Allergies  Social History   Social History  . Marital Status: Widowed    Spouse Name: N/A  . Number of Children: N/A  . Years of Education: N/A   Occupational History  . Not on file.   Social History Main Topics  . Smoking status: Never Smoker   . Smokeless tobacco: Never Used  . Alcohol Use: No  . Drug Use: No  . Sexual Activity: No   Other Topics Concern  . Not on file   Social History Narrative     Family History  Problem Relation Age of Onset  . Hyperlipidemia Daughter   . Hypertension Daughter   . Other Daughter  varicose veins  . Varicose Veins Daughter   . Cancer Sister     Breast  . Hyperlipidemia Sister   . Hypertension Sister   . Hypertension Daughter   . Hyperlipidemia Daughter      Review of Systems: All other systems reviewed and are otherwise negative except as noted above.  Physical Exam: Filed Vitals:   06/24/15 2035 06/24/15 2150 06/25/15 0632 06/25/15 1001  BP: 148/66 161/48 110/74 132/40  Pulse: 34 66 41 51  Temp:   97.6 F (36.4 C)   TempSrc:   Oral   Resp: 18 18 18    Height: 5\' 2"  (1.575 m)     Weight: 122 lb 6.4 oz (55.52 kg)     SpO2: 100% 100% 98%     GEN- The patient is elderly appearing, alert and oriented x 3 today.   HEENT: normocephalic, atraumatic; sclera clear, conjunctiva pink; hearing intact; oropharynx clear; neck supple    Lungs- Clear to ausculation bilaterally, normal work of breathing.  No wheezes, rales, rhonchi Heart- Regular rate and rhythm  GI- soft, non-tender, non-distended, bowel sounds present  Extremities- no clubbing, cyanosis, or edema; DP/PT/radial pulses 2+ bilaterally MS- age appropriate atrophy Skin- warm and dry, bilateral LE leg wraps Psych- euthymic mood, full affect Neuro- strength and sensation are intact  Labs:   Lab Results  Component Value Date   WBC 6.2 06/25/2015   HGB 14.7 06/25/2015   HCT 44.3 06/25/2015   MCV 94.5 06/25/2015   PLT 189 06/25/2015    Recent Labs Lab 06/24/15 2103  06/25/15 0245  NA  --   --  141  K  --   --  4.1  CL  --   --  104  CO2  --   --  28  BUN  --   --  30*  CREATININE  --   < > 1.49*  CALCIUM  --   --  9.9  PROT 8.3*  --   --   BILITOT 0.7  --   --   ALKPHOS 104  --   --   ALT 32  --   --   AST 40  --   --   GLUCOSE  --   --  95  < > = values in this interval not displayed.    Radiology/Studies: Dg Chest 2 View 06/24/2015   CLINICAL DATA:  Chest pain. Weakness. Initial encounter. RIGHT arm numbness and tingling for several months.  EXAM: CHEST  2 VIEW  COMPARISON:  05/11/2009.  FINDINGS: Progressive enlargement of the cardiopericardial silhouette compatible with CHF. Mild pulmonary vascular congestion. Old granulomatous disease with calcified RIGHT hilar lymph nodes. Partially radiopaque monitoring weights project over the chest. Aortic arch atherosclerosis. Blunting of the RIGHT costophrenic angle suggesting a small pleural effusion. There may be a small LEFT pleural effusion is well.  IMPRESSION: Mild CHF.   Electronically Signed   By: Dereck Ligas M.D.   On: 06/24/2015 11:59   Ct Head Wo Contrast 06/24/2015   CLINICAL DATA:  Weakness since the night of 06/23/2015. Initial encounter.  EXAM: CT HEAD WITHOUT CONTRAST  TECHNIQUE: Contiguous axial images were obtained from the base of the skull through the vertex without intravenous  contrast.  COMPARISON:  None.  FINDINGS: The patient has remote right parietal infarct. There is chronic microvascular ischemic change. No evidence of acute intracranial abnormality including hemorrhage, acute infarction, mass lesion, mass effect, midline shift or abnormal extra-axial fluid collection is identified. No hydrocephalus  or pneumocephalus. The calvarium is intact. Imaged paranasal sinuses and mastoid air cells are clear. Atherosclerotic vascular disease noted.  IMPRESSION: No acute abnormality.  Atrophy, chronic microvascular ischemic change and remote right parietal infarct.   Electronically Signed   By: Inge Rise M.D.   On: 06/24/2015 11:38    NLZ:JQBHA brady, rate 54, PAC  TELEMETRY: sinus brady, 50's, occasional blocked PAC's  Assessment/Plan: 1.  Sinus bradycardia The patient presented with weakness and was found to be bradycardic.  Her atenolol has been held with improvement in sinus rates. Recommend continuing to avoid AVN blocking agents.  Would ambulate with PT to assess HR response to exercise.  There is no acute indication for PPM and the patient would like to avoid invasive procedures if possible.   2.  HTN Avoid AVN blocking agents as above  Electrophysiology team to see as needed while here. Please call with questions.   Signed, Chanetta Marshall, NP 06/25/2015 12:43 PM  I have seen and examined this patient with Chanetta Marshall.  Agree with above, note added to reflect my findings.  On exam, regular rhythm, no murmurs.  Had evidence of sinus bradycardia on admission.  Atenolol held with improvement in sinus rates.  Would avoid all AV nodal blocking agents.  No current indication for PPM.     Will M. Camnitz MD 06/25/2015 3:11 PM

## 2015-06-25 NOTE — Evaluation (Signed)
Physical Therapy Evaluation Patient Details Name: Dorothy Noble MRN: [id number] DOB: 1921/10/03 Today's Date: 06/25/2015   History of Present Illness  Dorothy Noble is a 81 y.o. woman with a history of CVA after undergoing Right carotid endarterectomy and chronic right arm parasthesias.  Admitted with weakness and new urinary incontinence.  Clinical Impression  Patient presents with decreased independence with mobility due to deficits listed in PT problem list.  She will benefit from skilled PT in the acute setting to allow return home with assist and HHPT.  Spoke with family about needing assist for mobility and case manager aware.    Follow Up Recommendations Home health PT;Supervision for mobility/OOB    Equipment Recommendations  3in1 (PT)    Recommendations for Other Services       Precautions / Restrictions Precautions Precautions: Fall Precaution Comments: reports one previous fall this year      Mobility  Bed Mobility Overal bed mobility: Needs Assistance Bed Mobility: Supine to Sit;Sit to Supine     Supine to sit: Supervision Sit to supine: Min assist   General bed mobility comments: increased time for supine to sit with rail, assist for legs to supine, cues for positioning and scooting to [facility]  Transfers Overall transfer level: Needs assistance Equipment used: Rolling walker (2 wheeled) Transfers: Sit to/from Stand Sit to Stand: Min assist         General transfer comment: cues for hand placement, assist for up from bed, cues for anterior weight shift  Ambulation/Gait Ambulation/Gait assistance: Min assist;Min guard Ambulation Distance (Feet): 22 Feet Assistive device: Rolling walker (2 wheeled) Gait Pattern/deviations: Step-to pattern;Decreased stride length;Shuffle;Decreased stance time - left   Gait velocity interpretation: <1.8 ft/sec, indicative of risk for recurrent falls General Gait Details: assist with moving walker, and initially assist to prevent  posterior loss of balance, but improved with less support for balance and less assist for walker with more experience, assist and cues for turning with walker  Stairs            Wheelchair Mobility    Modified Rankin (Stroke Patients Only)       Balance Overall balance assessment: Needs assistance Sitting-balance support: No upper extremity supported Sitting balance-Leahy Scale: Poor Sitting balance - Comments: leans back over time, needing cues and at one point assist to prevent falling backwards; also needed assist for balance with dynamic activities like combing hair Postural control: Posterior lean Standing balance support: Bilateral upper extremity supported Standing balance-Leahy Scale: Poor Standing balance comment: posterior bias in standing, assist and walker for support                             Pertinent Vitals/Pain Pain Assessment: No/denies pain (unless touching lower legs)    Home Living Family/patient expects to be discharged to:: Private residence Living Arrangements: Alone Available Help at Discharge: Home health Type of Home: Apartment (senior living apartment) Home Access: Stairs to enter   Technical brewer of Steps: 1 Home Layout: One level Home Equipment: Cane - quad;Grab bars - tub/shower;Tub bench      Prior Function Level of Independence: Independent with assistive device(s)         Comments: daughter was helping with shower, but recently got [facility] aide, has RN for dressing changes (unna boots)     Hand Dominance        Extremity/Trunk Assessment               Lower  Extremity Assessment: Generalized weakness         Communication   Communication: No difficulties  Cognition Arousal/Alertness: Awake/alert Behavior During Therapy: WFL for tasks assessed/performed Overall Cognitive Status: Within Functional Limits for tasks assessed                      General Comments      Exercises         Assessment/Plan    PT Assessment Patient needs continued PT services  PT Diagnosis Generalized weakness;Abnormality of gait   PT Problem List Decreased strength;Decreased activity tolerance;Decreased balance;Decreased mobility;Decreased knowledge of use of DME  PT Treatment Interventions DME instruction;Gait training;Balance training;Functional mobility training;Patient/family education;Therapeutic activities;Therapeutic exercise   PT Goals (Current goals can be found in the Care Plan section) Acute Rehab PT Goals Patient Stated Goal: To get stronger PT Goal Formulation: With patient Time For Goal Achievement: 07/02/15 Potential to Achieve Goals: Good    Frequency Min 3X/week   Barriers to discharge Decreased caregiver support patient's dauighter interested in sitter agency list for helping with getting appropriate level of assist in the home.    Co-evaluation               End of Session Equipment Utilized During Treatment: Gait belt Activity Tolerance: Patient limited by fatigue Patient left: in bed;with family/visitor present;with bed alarm set      Functional Assessment Tool Used: Clinical Judgement Functional Limitation: Mobility: Walking and moving around Mobility: Walking and Moving Around Current Status (P9150): At least 20 percent but less than 40 percent impaired, limited or restricted Mobility: Walking and Moving Around Goal Status [phone]): At least 1 percent but less than 20 percent impaired, limited or restricted    Time: 1450-1525 PT Time Calculation (min) (ACUTE ONLY): 35 min   Charges:   PT Evaluation $Initial PT Evaluation Tier I: 1 Procedure PT Treatments $Gait Training: 8-22 mins   PT G Codes:   PT G-Codes **NOT FOR INPATIENT CLASS** Functional Assessment Tool Used: Clinical Judgement Functional Limitation: Mobility: Walking and moving around Mobility: Walking and Moving Around Current Status (Y8016): At least 20 percent but less than 40  percent impaired, limited or restricted Mobility: Walking and Moving Around Goal Status [phone]): At least 1 percent but less than 20 percent impaired, limited or restricted    [facility] 06/25/2015, 4:42 PM  Magda Kiel, [place] 06/25/2015

## 2015-06-25 NOTE — Progress Notes (Signed)
Spoke with Dr. Waldron Labs regarding pt HR 51 and atenolol due at 1000. Per MD, hold medication. Nursing staff will continue to monitor. Earleen Reaper, RN

## 2015-06-25 NOTE — Consult Note (Signed)
WOC wound consult note Reason for Consult: bilateral LE  Patient is slightly confused, able to tell me some history but somewhat hesitant on dates and such. She reports she has a HHRN that comes 3 x wk. She presents with compression wraps on her bilateral LE.  I have removed these and noted that she does not have any active weeping or ulcers on the RLE. She has two areas on the LLE. It is apparent  that she has had some edema that is now much improved. The patient reports her legs are "much better"  Wound type: 2 small areas, appear to be ruptured bulla Pressure Ulcer POA:/No Measurement:1.0cm x 0.5cm x 0.1cm and 0.2cm x 0.2cm x 0.1cm  Wound bed: clean, pink, dry Drainage (amount, consistency, odor) none Periwound: intact but with venous dermatitis  Dressing procedure/placement/frequency: Will continue dry boot compression due to no active ulcers at this time.  Kerlix and Coban.  Will add emollient to the legs prior to placement of compression.   Resume HHRN for assessment and management of LE once DC  WOC will follow along with you for compression wrap changes T/Friday Thelbert Gartin Liane Comber RN,CWOCN [phone]

## 2015-06-25 NOTE — Care Management Note (Addendum)
Case Management Note  Patient Details  Name: Dorothy Noble MRN: [id number] Date of Birth: 10/29/1921  Subjective/Objective:     Date:  06/25/15 Spoke with patient at the bedside. Introduced self as Tourist information centre manager and explained role in discharge planning and how to be reached. Verified patient lives in town, alone has DME single point cane  . Expressed no potential need for no other DME. Verified patient anticipates to go home alone at [facility] which [facility] a senior [facility] home at time of discharge and will have full-time supervision by family  at this time to best of their knowledge. Patient  denied needing help with their medication. Patient  is driven by daughter to MD appointments. Verified patient has PCP Laurann Montana. Patient is active with [facility] for [facility], will like to resume this and will add HHPT.  Left message for Corliss Blacker with [facility] that patient will be dc tomorrow.  Made Referral to [facility] with [facility] for DME 3 n 1 and rolling walker, will bring up to patient's room.   Plan: CM will continue to follow for discharge planning and [facility] resources.                Action/Plan:   Expected Discharge Date:                  Expected Discharge Plan:  [place]  In-House Referral:     Discharge planning Services  CM Consult  Post Acute Care Choice:    Choice offered to:     DME Arranged:    DME Agency:     HH Arranged:    [place] Agency:     Status of Service:  In process, will continue to follow  Medicare Important Message Given:    Date Medicare IM Given:    Medicare IM give by:    Date Additional Medicare IM Given:    Additional Medicare Important Message give by:     If discussed at [place] of Stay Meetings, dates discussed:    Additional Comments:  Zenon Mayo, RN 06/25/2015, 2:12 PM

## 2015-06-26 DIAGNOSIS — E440 Moderate protein-calorie malnutrition: Secondary | ICD-10-CM | POA: Diagnosis not present

## 2015-06-26 DIAGNOSIS — R001 Bradycardia, unspecified: Secondary | ICD-10-CM

## 2015-06-26 DIAGNOSIS — I10 Essential (primary) hypertension: Secondary | ICD-10-CM

## 2015-06-26 DIAGNOSIS — R531 Weakness: Secondary | ICD-10-CM | POA: Diagnosis not present

## 2015-06-26 LAB — CBC
HCT: 44.5 % (ref 36.0–46.0)
Hemoglobin: 14.7 g/dL (ref 12.0–15.0)
MCH: 31.3 pg (ref 26.0–34.0)
MCHC: 33 g/dL (ref 30.0–36.0)
MCV: 94.7 fL (ref 78.0–100.0)
PLATELETS: 193 10*3/uL (ref 150–400)
RBC: 4.7 MIL/uL (ref 3.87–5.11)
RDW: 13.5 % (ref 11.5–15.5)
WBC: 5.7 10*3/uL (ref 4.0–10.5)

## 2015-06-26 LAB — BASIC METABOLIC PANEL
Anion gap: 8 (ref 5–15)
BUN: 36 mg/dL — AB (ref 6–20)
CHLORIDE: 110 mmol/L (ref 101–111)
CO2: 25 mmol/L (ref 22–32)
CREATININE: 1.44 mg/dL — AB (ref 0.44–1.00)
Calcium: 9.6 mg/dL (ref 8.9–10.3)
GFR calc Af Amer: 36 mL/min — ABNORMAL LOW (ref >60)
GFR, EST NON AFRICAN AMERICAN: 31 mL/min — AB (ref >60)
GLUCOSE: 92 mg/dL (ref 65–99)
POTASSIUM: 4 mmol/L (ref 3.5–5.1)
Sodium: 143 mmol/L (ref 135–145)

## 2015-06-26 MED ORDER — MEGESTROL ACETATE 400 MG/10ML PO SUSP: 400.0000 mg | Freq: Every day | ORAL | Status: DC

## 2015-06-26 MED ORDER — HYDRALAZINE HCL 25 MG PO TABS: 25.0000 mg | ORAL_TABLET | Freq: Three times a day (TID) | ORAL | Status: AC

## 2015-06-26 MED ORDER — LORAZEPAM 0.5 MG PO TABS
0.5000 mg | ORAL_TABLET | Freq: Once | ORAL | Status: DC
  Filled 2015-06-26: qty 1

## 2015-06-26 MED ORDER — ENSURE ENLIVE PO LIQD: 237.0000 mL | Freq: Three times a day (TID) | ORAL | Status: AC

## 2015-06-26 NOTE — Progress Notes (Signed)
Patient having increased confusions, agitations and aggressiveness at the moment. Removed heart monitor and refusing staff to put it back on her. CCMD called to put patient on standby until patient calms down. On call NP paged, awaiting on response. Will keep monitoring.

## 2015-06-26 NOTE — Discharge Summary (Signed)
Dorothy Noble, is a 81 y.o. female  DOB 08/09/1922  MRN [id number].  Admission date:  06/24/2015  Admitting Physician  Lily Kocher, MD  Discharge Date:  06/26/2015   Primary MD  Osborne Casco, MD  Recommendations for primary care physician for things to follow:  - Please check basic labs including CBC, BMP during next visit - Agent to follow with [facility]  cardiology group, if she has recurrence of her symptoms or bradycardia as an outpatient .   Admission Diagnosis  Gait instability [R26.81] Generalized weakness [R53.1] Essential hypertension [I10]   Discharge Diagnosis  Gait instability [R26.81] Generalized weakness [R53.1] Essential hypertension [I10]    Active Problems:   Weakness   Malnutrition of moderate degree      Past Medical History  Diagnosis Date  . Hypertension   . Arthritis   . Herniated disc   . Stroke   . Carotid artery occlusion     Past Surgical History  Procedure Laterality Date  . Carotid endarderectomy    . Blood clot removed from r ankle    . Carotid endarterectomy Right 05-14-09    cea       History of present illness and  Hospital Course:     Kindly see H&P for history of present illness and admission details, please review complete Labs, Consult reports and Test reports for all details in brief  HPI  from the history and physical done on the day of admission HPI: Dorothy Noble is a 81 y.o. woman with a history of CVA after undergoing Right carotid endarterectomy and chronic right arm parasthesias who feels that she was in her baseline state of health until last night. She developed urinary incontinence, which is atypical for her, and increased weakness. She woke up this morning to find that she had wet her bed. She had a single episode of nausea and nonbloody emesis. She had light-headedness but no syncope. No falls or head trauma. She denies  dysuria. One of her daughters came over and observed that her mother seemed weaker than normal and was unsteady on her feet (she is supposed to use a walker at baseline). She called 911.   Tonight, she is accompanied by three daughters and twin grandsons. Her daughters all agree that her PO intake has been inadequate. She has lost enough weight to go from a size 12 to a size 8 over the past year. Her PCP is aware and has recommended nutritional supplements in the past.  The patient was found to be hypertensive upon arrival to the ED. Her blood pressure improved after IV hydralazine. She reported mild dizziness with sitting up in bed.  Evaluation in the ED has revealed elevated BUN and creatinine levels with ketones in her urine, suggestive of dehydration. No acute findings on head CT. Chest xray shows cardiomegaly and possible CHF. The family is concerned about patient's safety at home. She is being admitted for observation.   Hospital Course   Generalized weakness/dizziness - This is most likely related to dehydration,  possibly bradycardia, improved with IV fluid - Negative urinalysis, no evidence of infectious process. - Tele significant for bradycardia , please see below discussion. - Seen by PT, will need home PT  CKD stage III - Creatinine appears to be at baseline  Bradycardia - patient heartrate was noticed to be in the 40s and low 50s, monitor on telemetry, atenolol has been stopped, after significantly improved after stopping atenolol, heart rate range currently in 60s, cardiology has been consulted ,avoid AV nodal blocking agents.  2-D echo done with EF 60-65% .  Hypertension - Atenolol has been held for bradycardia, started on hydralazine 25 mg oral 3 times daily  Protein calorie malnutrition - started on ensure, and Megace.  History of severe colitis prior to To admission - Continue doxycycline   Discharge Condition:  Stable    Follow UP  Follow-up  Information    Follow up with Osborne Casco, MD. Schedule an appointment as soon as possible for a visit in 5 days.   Specialty:  Family Medicine   Contact information:   [address] [place] [address] [phone]       Follow up with [facility].   Why:  Resume HHRN, HHPT   Contact information:   [address] [place] [address] [phone]       Follow up with [facility].   Why:  rolling walker and 3 n 1, will bring to patient's room before dc   Contact information:   [address] [place] [address] [phone]       Follow up with Osborne Casco, MD. Call in 1 week.   Specialty:  Family Medicine   Why:  Posthospitalization follow-up   Contact information:   [address] [place]  [address] [phone]         Discharge Instructions  and  Discharge Medications     Discharge Instructions    Diet - low sodium heart healthy    Complete by:  As directed      Discharge instructions    Complete by:  As directed   Follow with Primary MD Osborne Casco, MD in 7 days   Get CBC, CMP, 2 view Chest X ray checked  by Primary MD next visit.    Activity: As tolerated with Full fall precautions use walker/cane & assistance as needed   Disposition Home    Diet: Heart Healthy  , with feeding assistance and aspiration precautions.  For Heart failure patients - Check your Weight same time everyday, if you gain over 2 pounds, or you develop in leg swelling, experience more shortness of breath or chest pain, call your Primary MD immediately. Follow Cardiac Low Salt Diet and 1.5 lit/day fluid restriction.   On your next visit with your primary care physician please Get Medicines reviewed and adjusted.   Please request your Prim.MD to go over all Hospital Tests and Procedure/Radiological results at the follow up, please get all Hospital records sent to your  Prim MD by signing hospital release before you go home.   If you experience worsening of your admission symptoms, develop shortness of breath, life threatening emergency, suicidal or homicidal thoughts you must seek medical attention immediately by calling 911 or calling your MD immediately  if symptoms less severe.  You Must read complete instructions/literature along with all the possible adverse reactions/side effects for all the Medicines you take and that have been prescribed  to you. Take any new Medicines after you have completely understood and accpet all the possible adverse reactions/side effects.   Do not drive, operating heavy machinery, perform activities at heights, swimming or participation in water activities or provide baby sitting services if your were admitted for syncope or siezures until you have seen by Primary MD or a Neurologist and advised to do so again.  Do not drive when taking Pain medications.    Do not take more than prescribed Pain, Sleep and Anxiety Medications  Special Instructions: If you have smoked or chewed Tobacco  in the last 2 yrs please stop smoking, stop any regular Alcohol  and or any Recreational drug use.  Wear Seat belts while driving.   Please note  You were cared for by a hospitalist during your hospital stay. If you have any questions about your discharge medications or the care you received while you were in the hospital after you are discharged, you can call the unit and asked to speak with the hospitalist on call if the hospitalist that took care of you is not available. Once you are discharged, your primary care physician will handle any further medical issues. Please note that NO REFILLS for any discharge medications will be authorized once you are discharged, as it is imperative that you return to your primary care physician (or establish a relationship with a primary care physician if you do not have one) for your aftercare needs so that  they can reassess your need for medications and monitor your lab values.     Increase activity slowly    Complete by:  As directed             Medication List    STOP taking these medications        atenolol 50 MG tablet  Commonly known as:  TENORMIN      TAKE these medications        aspirin EC 81 MG tablet  Take 81 mg by mouth daily.     BESIVANCE 0.6 % Susp  Generic drug:  Besifloxacin HCl  Place 1 drop into the left eye 4 (four) times daily. 2 days after each monthly eye injection     doxycycline 100 MG capsule  Commonly known as:  VIBRAMYCIN  Take 1 capsule by mouth every 12 (twelve) hours.     feeding supplement (ENSURE ENLIVE) Liqd  Take 237 mLs by mouth 3 (three) times daily between meals.     Fish Oil 1000 MG Caps  Take by mouth 3 (three) times daily.     hydrALAZINE 25 MG tablet  Commonly known as:  APRESOLINE  Take 1 tablet (25 mg total) by mouth 3 (three) times daily.     megestrol 400 MG/10ML suspension  Commonly known as:  MEGACE  Take 10 mLs (400 mg total) by mouth daily.     multivitamins ther. w/minerals Tabs tablet  Take 1 tablet by mouth daily.     RED YEAST RICE PO  Take 2 tablets by mouth at bedtime.          Diet and Activity recommendation: See Discharge Instructions above   Consults obtained -  cardiology   Major procedures and Radiology Reports - PLEASE review detailed and final reports for all details, in brief -      Dg Chest 2 View  06/24/2015   CLINICAL DATA:  Chest pain. Weakness. Initial encounter. RIGHT arm numbness and tingling for several months.  EXAM: CHEST  2 VIEW  COMPARISON:  05/11/2009.  FINDINGS: Progressive enlargement of the cardiopericardial silhouette compatible with CHF. Mild pulmonary vascular congestion. Old granulomatous disease with calcified RIGHT hilar lymph nodes. Partially radiopaque monitoring weights project over the chest. Aortic arch atherosclerosis. Blunting of the RIGHT costophrenic angle  suggesting a small pleural effusion. There may be a small LEFT pleural effusion is well.  IMPRESSION: Mild CHF.   Electronically Signed   By: Dereck Ligas M.D.   On: 06/24/2015 11:59   Ct Head Wo Contrast  06/24/2015   CLINICAL DATA:  Weakness since the night of 06/23/2015. Initial encounter.  EXAM: CT HEAD WITHOUT CONTRAST  TECHNIQUE: Contiguous axial images were obtained from the base of the skull through the vertex without intravenous contrast.  COMPARISON:  None.  FINDINGS: The patient has remote right parietal infarct. There is chronic microvascular ischemic change. No evidence of acute intracranial abnormality including hemorrhage, acute infarction, mass lesion, mass effect, midline shift or abnormal extra-axial fluid collection is identified. No hydrocephalus or pneumocephalus. The calvarium is intact. Imaged paranasal sinuses and mastoid air cells are clear. Atherosclerotic vascular disease noted.  IMPRESSION: No acute abnormality.  Atrophy, chronic microvascular ischemic change and remote right parietal infarct.   Electronically Signed   By: Inge Rise M.D.   On: 06/24/2015 11:38    Micro Results   No results found for this or any previous visit (from the past 240 hour(s)).     Today   Subjective:   Dorothy Noble today has no headache,no chest abdominal pain,no new weakness tingling or numbness, feels much better wants to go home today.   Objective:   Blood pressure 138/69, pulse 58, temperature 98.2 F (36.8 C), temperature source Oral, resp. rate 18, height 5\' 2"  (1.575 m), weight 55.52 kg (122 lb 6.4 oz), SpO2 96 %.   Intake/Output Summary (Last 24 hours) at 06/26/15 1415 Last data filed at 06/26/15 1043  Gross per 24 hour  Intake    120 ml  Output   1400 ml  Net  -1280 ml    Exam Awake Alert, Oriented x 3,  [place].AT,PERRAL Supple Neck,No JVD, No cervical lymphadenopathy appriciated.  Symmetrical Chest wall movement, Good air movement bilaterally,  RRR,No  Gallops,Rubs or new Murmurs, No Parasternal Heave +ve B.Sounds, Abd Soft, Non tender, No organomegaly appriciated, No rebound -guarding or rigidity. No Cyanosis, Clubbing or edema, No new Rash or bruise  Data Review   CBC w Diff: Lab Results  Component Value Date   WBC 5.7 06/26/2015   HGB 14.7 06/26/2015   HCT 44.5 06/26/2015   PLT 193 06/26/2015   LYMPHOPCT 18 06/24/2015   MONOPCT 4 06/24/2015   EOSPCT 0 06/24/2015   BASOPCT 0 06/24/2015    CMP: Lab Results  Component Value Date   NA 143 06/26/2015   K 4.0 06/26/2015   CL 110 06/26/2015   CO2 25 06/26/2015   BUN 36* 06/26/2015   CREATININE 1.44* 06/26/2015   PROT 8.3* 06/24/2015   ALBUMIN 3.9 06/24/2015   BILITOT 0.7 06/24/2015   ALKPHOS 104 06/24/2015   AST 40 06/24/2015   ALT 32 06/24/2015  .   Total Time in preparing paper work, data evaluation and todays exam - 35 minutes  ELGERGAWY, DAWOOD M.D on 06/26/2015 at 2:15 PM  [facility]   Office  [phone]

## 2015-06-26 NOTE — Progress Notes (Signed)
Physical Therapy Treatment Patient Details Name: Dorothy Noble MRN: [id number] DOB: 1922-06-06 Today's Date: 06/26/2015    History of Present Illness Dorothy Noble is a 81 y.o. woman with a history of CVA after undergoing Right carotid endarterectomy and chronic right arm parasthesias.  Admitted with weakness and new urinary incontinence.    PT Comments    Patient continues to require A for mobility. She presented with posterior bias throughout mobility with standing and with gait. Will need 24/7 assistance if discharged home. If patient does not have 24/7 assist would recommend SNF for ongoing rehab. Noted that patient had confusion overnight but was appropriate throughout PT treatment.   Follow Up Recommendations  Home health PT;Supervision for mobility/OOB     Equipment Recommendations       Recommendations for Other Services       Precautions / Restrictions Precautions Precautions: Fall Precaution Comments: reports one previous fall this year    Mobility  Bed Mobility         Supine to sit: Supervision        Transfers Overall transfer level: Needs assistance Equipment used: Rolling walker (2 wheeled)   Sit to Stand: Mod assist         General transfer comment: cues for hand placement, assist for up from bed, cues for anterior weight shift. Patient with very heavy posterior lean this session  Ambulation/Gait Ambulation/Gait assistance: Min assist;+2 safety/equipment Ambulation Distance (Feet): 30 Feet Assistive device: Rolling walker (2 wheeled);2 person hand held assist Gait Pattern/deviations: Step-through pattern;Decreased stride length;Shuffle   Gait velocity interpretation: Below normal speed for age/gender General Gait Details: Patient have diffculty moving RW thus attempted +2 HHA. Patient with very heavy posterior lean this session requiring support throughout ambulation.    Stairs            Wheelchair Mobility    Modified Rankin (Stroke  Patients Only)       Balance                                    Cognition Arousal/Alertness: Awake/alert Behavior During Therapy: WFL for tasks assessed/performed Overall Cognitive Status: Within Functional Limits for tasks assessed                      Exercises      General Comments        Pertinent Vitals/Pain Pain Assessment: No/denies pain    Home Living                      Prior Function            PT Goals (current goals can now be found in the care plan section) Progress towards PT goals: Progressing toward goals    Frequency  Min 3X/week    PT Plan Current plan remains appropriate    Co-evaluation             End of Session Equipment Utilized During Treatment: Gait belt Activity Tolerance: Patient limited by fatigue Patient left: in chair;with call bell/phone within reach;with chair alarm set     Time: [id number] PT Time Calculation (min) (ACUTE ONLY): 24 min  Charges:  $Gait Training: 8-22 mins $Therapeutic Activity: 8-22 mins                    G Codes:      Jacqualyn Posey 06/26/2015, 8:46  AM  06/26/2015 Robinette, Tonia Brooms PTA

## 2015-06-26 NOTE — Progress Notes (Signed)
Patient still very confused, refusing Staff to place her heart monitor on. On Call Donnal Debar) made aware. CCMD also called to put patient on standby until patient calms down. Will continue to monitor.

## 2015-06-26 NOTE — Progress Notes (Signed)
Cm notes request for DME called to [facility] DME yesterday for delivery by previous CM.  This CM has called [facility] DME rep, Merry Proud to please deliver the 3n1 and rolling walker to room so pt can discharge.

## 2015-06-26 NOTE — Progress Notes (Signed)
06/26/15 Patient to be discharged home today. IV site removed, and discharge instructions reviewed with patient and family.

## 2015-07-15 ENCOUNTER — Encounter (INDEPENDENT_AMBULATORY_CARE_PROVIDER_SITE_OTHER): Payer: Medicare Other | Admitting: Ophthalmology

## 2015-07-21 ENCOUNTER — Encounter (INDEPENDENT_AMBULATORY_CARE_PROVIDER_SITE_OTHER): Payer: Medicare Other | Admitting: Ophthalmology

## 2015-07-21 DIAGNOSIS — I10 Essential (primary) hypertension: Secondary | ICD-10-CM | POA: Diagnosis not present

## 2015-07-21 DIAGNOSIS — H34812 Central retinal vein occlusion, left eye, with macular edema: Secondary | ICD-10-CM | POA: Diagnosis not present

## 2015-07-21 DIAGNOSIS — H35033 Hypertensive retinopathy, bilateral: Secondary | ICD-10-CM | POA: Diagnosis not present

## 2015-07-21 DIAGNOSIS — H43813 Vitreous degeneration, bilateral: Secondary | ICD-10-CM

## 2015-08-18 ENCOUNTER — Encounter (INDEPENDENT_AMBULATORY_CARE_PROVIDER_SITE_OTHER): Payer: Medicare Other | Admitting: Ophthalmology

## 2015-08-18 DIAGNOSIS — H43813 Vitreous degeneration, bilateral: Secondary | ICD-10-CM

## 2015-08-18 DIAGNOSIS — H35033 Hypertensive retinopathy, bilateral: Secondary | ICD-10-CM

## 2015-08-18 DIAGNOSIS — I10 Essential (primary) hypertension: Secondary | ICD-10-CM | POA: Diagnosis not present

## 2015-08-18 DIAGNOSIS — H34812 Central retinal vein occlusion, left eye, with macular edema: Secondary | ICD-10-CM | POA: Diagnosis not present

## 2015-09-16 ENCOUNTER — Encounter (INDEPENDENT_AMBULATORY_CARE_PROVIDER_SITE_OTHER): Payer: Medicare Other | Admitting: Ophthalmology

## 2015-09-16 DIAGNOSIS — H35033 Hypertensive retinopathy, bilateral: Secondary | ICD-10-CM | POA: Diagnosis not present

## 2015-09-16 DIAGNOSIS — I10 Essential (primary) hypertension: Secondary | ICD-10-CM | POA: Diagnosis not present

## 2015-09-16 DIAGNOSIS — H43813 Vitreous degeneration, bilateral: Secondary | ICD-10-CM

## 2015-09-16 DIAGNOSIS — H34812 Central retinal vein occlusion, left eye, with macular edema: Secondary | ICD-10-CM | POA: Diagnosis not present

## 2015-10-20 ENCOUNTER — Encounter (INDEPENDENT_AMBULATORY_CARE_PROVIDER_SITE_OTHER): Payer: Medicare HMO | Admitting: Ophthalmology

## 2015-10-20 DIAGNOSIS — H43813 Vitreous degeneration, bilateral: Secondary | ICD-10-CM

## 2015-10-20 DIAGNOSIS — H35033 Hypertensive retinopathy, bilateral: Secondary | ICD-10-CM

## 2015-10-20 DIAGNOSIS — I10 Essential (primary) hypertension: Secondary | ICD-10-CM

## 2015-10-20 DIAGNOSIS — H34812 Central retinal vein occlusion, left eye, with macular edema: Secondary | ICD-10-CM | POA: Diagnosis not present

## 2016-01-20 ENCOUNTER — Encounter (INDEPENDENT_AMBULATORY_CARE_PROVIDER_SITE_OTHER): Payer: Medicare HMO | Admitting: Ophthalmology

## 2016-01-20 DIAGNOSIS — H35033 Hypertensive retinopathy, bilateral: Secondary | ICD-10-CM | POA: Diagnosis not present

## 2016-01-20 DIAGNOSIS — H34812 Central retinal vein occlusion, left eye, with macular edema: Secondary | ICD-10-CM

## 2016-01-20 DIAGNOSIS — H43813 Vitreous degeneration, bilateral: Secondary | ICD-10-CM

## 2016-01-20 DIAGNOSIS — I10 Essential (primary) hypertension: Secondary | ICD-10-CM | POA: Diagnosis not present

## 2016-02-16 DIAGNOSIS — Z0000 Encounter for general adult medical examination without abnormal findings: Secondary | ICD-10-CM | POA: Diagnosis not present

## 2016-02-16 DIAGNOSIS — I10 Essential (primary) hypertension: Secondary | ICD-10-CM | POA: Diagnosis not present

## 2016-02-17 ENCOUNTER — Encounter (INDEPENDENT_AMBULATORY_CARE_PROVIDER_SITE_OTHER): Payer: Medicare HMO | Admitting: Ophthalmology

## 2016-02-18 ENCOUNTER — Other Ambulatory Visit: Payer: Self-pay | Admitting: Family Medicine

## 2016-02-18 ENCOUNTER — Ambulatory Visit
Admission: RE | Admit: 2016-02-18 | Discharge: 2016-02-18 | Disposition: A | Discharge status: Home / Self Care | Payer: Medicare HMO | Source: Ambulatory Visit | Attending: Family Medicine | Admitting: Family Medicine

## 2016-02-18 DIAGNOSIS — M25552 Pain in left hip: Secondary | ICD-10-CM | POA: Diagnosis not present

## 2016-02-18 DIAGNOSIS — T148XXA Other injury of unspecified body region, initial encounter: Secondary | ICD-10-CM

## 2016-02-18 DIAGNOSIS — M533 Sacrococcygeal disorders, not elsewhere classified: Secondary | ICD-10-CM | POA: Diagnosis not present

## 2016-02-25 DIAGNOSIS — K219 Gastro-esophageal reflux disease without esophagitis: Secondary | ICD-10-CM | POA: Diagnosis not present

## 2016-02-25 DIAGNOSIS — R69 Illness, unspecified: Secondary | ICD-10-CM | POA: Diagnosis not present

## 2016-02-25 DIAGNOSIS — I872 Venous insufficiency (chronic) (peripheral): Secondary | ICD-10-CM | POA: Diagnosis not present

## 2016-02-25 DIAGNOSIS — I779 Disorder of arteries and arterioles, unspecified: Secondary | ICD-10-CM | POA: Diagnosis not present

## 2016-02-25 DIAGNOSIS — Z0000 Encounter for general adult medical examination without abnormal findings: Secondary | ICD-10-CM | POA: Diagnosis not present

## 2016-02-25 DIAGNOSIS — M4806 Spinal stenosis, lumbar region: Secondary | ICD-10-CM | POA: Diagnosis not present

## 2016-02-25 DIAGNOSIS — I129 Hypertensive chronic kidney disease with stage 1 through stage 4 chronic kidney disease, or unspecified chronic kidney disease: Secondary | ICD-10-CM | POA: Diagnosis not present

## 2016-02-25 DIAGNOSIS — N183 Chronic kidney disease, stage 3 (moderate): Secondary | ICD-10-CM | POA: Diagnosis not present

## 2016-02-25 DIAGNOSIS — M1990 Unspecified osteoarthritis, unspecified site: Secondary | ICD-10-CM | POA: Diagnosis not present

## 2016-02-25 DIAGNOSIS — E039 Hypothyroidism, unspecified: Secondary | ICD-10-CM | POA: Diagnosis not present

## 2016-03-06 ENCOUNTER — Ambulatory Visit: Payer: Medicare Other | Admitting: Family

## 2016-03-06 ENCOUNTER — Encounter (HOSPITAL_COMMUNITY): Payer: Medicare Other

## 2016-03-09 ENCOUNTER — Encounter (INDEPENDENT_AMBULATORY_CARE_PROVIDER_SITE_OTHER): Payer: Medicare HMO | Admitting: Ophthalmology

## 2016-03-09 DIAGNOSIS — H35033 Hypertensive retinopathy, bilateral: Secondary | ICD-10-CM | POA: Diagnosis not present

## 2016-03-09 DIAGNOSIS — H34812 Central retinal vein occlusion, left eye, with macular edema: Secondary | ICD-10-CM | POA: Diagnosis not present

## 2016-03-09 DIAGNOSIS — I10 Essential (primary) hypertension: Secondary | ICD-10-CM

## 2016-03-09 DIAGNOSIS — H43813 Vitreous degeneration, bilateral: Secondary | ICD-10-CM | POA: Diagnosis not present

## 2016-04-13 ENCOUNTER — Encounter (INDEPENDENT_AMBULATORY_CARE_PROVIDER_SITE_OTHER): Payer: Medicare HMO | Admitting: Ophthalmology

## 2016-04-25 DIAGNOSIS — H348122 Central retinal vein occlusion, left eye, stable: Secondary | ICD-10-CM | POA: Diagnosis not present

## 2016-04-25 DIAGNOSIS — H353111 Nonexudative age-related macular degeneration, right eye, early dry stage: Secondary | ICD-10-CM | POA: Diagnosis not present

## 2016-04-25 DIAGNOSIS — H31012 Macula scars of posterior pole (postinflammatory) (post-traumatic), left eye: Secondary | ICD-10-CM | POA: Diagnosis not present

## 2016-04-25 DIAGNOSIS — Z961 Presence of intraocular lens: Secondary | ICD-10-CM | POA: Diagnosis not present

## 2016-07-21 DIAGNOSIS — R69 Illness, unspecified: Secondary | ICD-10-CM | POA: Diagnosis not present

## 2016-10-26 DIAGNOSIS — Z961 Presence of intraocular lens: Secondary | ICD-10-CM | POA: Diagnosis not present

## 2016-10-26 DIAGNOSIS — H353121 Nonexudative age-related macular degeneration, left eye, early dry stage: Secondary | ICD-10-CM | POA: Diagnosis not present

## 2016-10-26 DIAGNOSIS — H353114 Nonexudative age-related macular degeneration, right eye, advanced atrophic with subfoveal involvement: Secondary | ICD-10-CM | POA: Diagnosis not present

## 2016-10-26 DIAGNOSIS — H10413 Chronic giant papillary conjunctivitis, bilateral: Secondary | ICD-10-CM | POA: Diagnosis not present

## 2016-10-26 DIAGNOSIS — H348122 Central retinal vein occlusion, left eye, stable: Secondary | ICD-10-CM | POA: Diagnosis not present

## 2016-10-26 DIAGNOSIS — H04123 Dry eye syndrome of bilateral lacrimal glands: Secondary | ICD-10-CM | POA: Diagnosis not present

## 2017-01-24 DIAGNOSIS — N183 Chronic kidney disease, stage 3 (moderate): Secondary | ICD-10-CM | POA: Diagnosis not present

## 2017-01-24 DIAGNOSIS — I779 Disorder of arteries and arterioles, unspecified: Secondary | ICD-10-CM | POA: Diagnosis not present

## 2017-01-24 DIAGNOSIS — I129 Hypertensive chronic kidney disease with stage 1 through stage 4 chronic kidney disease, or unspecified chronic kidney disease: Secondary | ICD-10-CM | POA: Diagnosis not present

## 2017-01-24 DIAGNOSIS — E46 Unspecified protein-calorie malnutrition: Secondary | ICD-10-CM | POA: Diagnosis not present

## 2017-01-24 DIAGNOSIS — I5030 Unspecified diastolic (congestive) heart failure: Secondary | ICD-10-CM | POA: Diagnosis not present

## 2017-01-24 DIAGNOSIS — E039 Hypothyroidism, unspecified: Secondary | ICD-10-CM | POA: Diagnosis not present

## 2017-01-24 DIAGNOSIS — E7800 Pure hypercholesterolemia, unspecified: Secondary | ICD-10-CM | POA: Diagnosis not present

## 2017-05-24 DIAGNOSIS — Z961 Presence of intraocular lens: Secondary | ICD-10-CM | POA: Diagnosis not present

## 2017-05-24 DIAGNOSIS — H04123 Dry eye syndrome of bilateral lacrimal glands: Secondary | ICD-10-CM | POA: Diagnosis not present

## 2017-05-24 DIAGNOSIS — H353123 Nonexudative age-related macular degeneration, left eye, advanced atrophic without subfoveal involvement: Secondary | ICD-10-CM | POA: Diagnosis not present

## 2017-05-24 DIAGNOSIS — H348122 Central retinal vein occlusion, left eye, stable: Secondary | ICD-10-CM | POA: Diagnosis not present

## 2017-05-24 DIAGNOSIS — H10413 Chronic giant papillary conjunctivitis, bilateral: Secondary | ICD-10-CM | POA: Diagnosis not present

## 2017-05-24 DIAGNOSIS — H353111 Nonexudative age-related macular degeneration, right eye, early dry stage: Secondary | ICD-10-CM | POA: Diagnosis not present

## 2017-07-27 DIAGNOSIS — I5030 Unspecified diastolic (congestive) heart failure: Secondary | ICD-10-CM | POA: Diagnosis not present

## 2017-07-27 DIAGNOSIS — N183 Chronic kidney disease, stage 3 (moderate): Secondary | ICD-10-CM | POA: Diagnosis not present

## 2017-07-27 DIAGNOSIS — R69 Illness, unspecified: Secondary | ICD-10-CM | POA: Diagnosis not present

## 2017-07-27 DIAGNOSIS — J309 Allergic rhinitis, unspecified: Secondary | ICD-10-CM | POA: Diagnosis not present

## 2017-07-27 DIAGNOSIS — Z0000 Encounter for general adult medical examination without abnormal findings: Secondary | ICD-10-CM | POA: Diagnosis not present

## 2017-07-27 DIAGNOSIS — E7800 Pure hypercholesterolemia, unspecified: Secondary | ICD-10-CM | POA: Diagnosis not present

## 2017-07-27 DIAGNOSIS — E46 Unspecified protein-calorie malnutrition: Secondary | ICD-10-CM | POA: Diagnosis not present

## 2017-07-27 DIAGNOSIS — R413 Other amnesia: Secondary | ICD-10-CM | POA: Diagnosis not present

## 2017-07-27 DIAGNOSIS — I872 Venous insufficiency (chronic) (peripheral): Secondary | ICD-10-CM | POA: Diagnosis not present

## 2017-07-27 DIAGNOSIS — M1990 Unspecified osteoarthritis, unspecified site: Secondary | ICD-10-CM | POA: Diagnosis not present

## 2017-07-27 DIAGNOSIS — I779 Disorder of arteries and arterioles, unspecified: Secondary | ICD-10-CM | POA: Diagnosis not present

## 2017-07-27 DIAGNOSIS — I129 Hypertensive chronic kidney disease with stage 1 through stage 4 chronic kidney disease, or unspecified chronic kidney disease: Secondary | ICD-10-CM | POA: Diagnosis not present

## 2017-07-27 DIAGNOSIS — K219 Gastro-esophageal reflux disease without esophagitis: Secondary | ICD-10-CM | POA: Diagnosis not present

## 2017-07-27 DIAGNOSIS — E039 Hypothyroidism, unspecified: Secondary | ICD-10-CM | POA: Diagnosis not present

## 2017-09-27 IMAGING — CR DG HIP (WITH OR WITHOUT PELVIS) 2-3V*L*
2 series · 2 of 2 positions shown · non-contrast
Comparison: None.

CLINICAL DATA: Posterior left hip pain.  Recent fall.

EXAM:
DG HIP (WITH OR WITHOUT PELVIS) 2-3V LEFT

[t hip ap left]
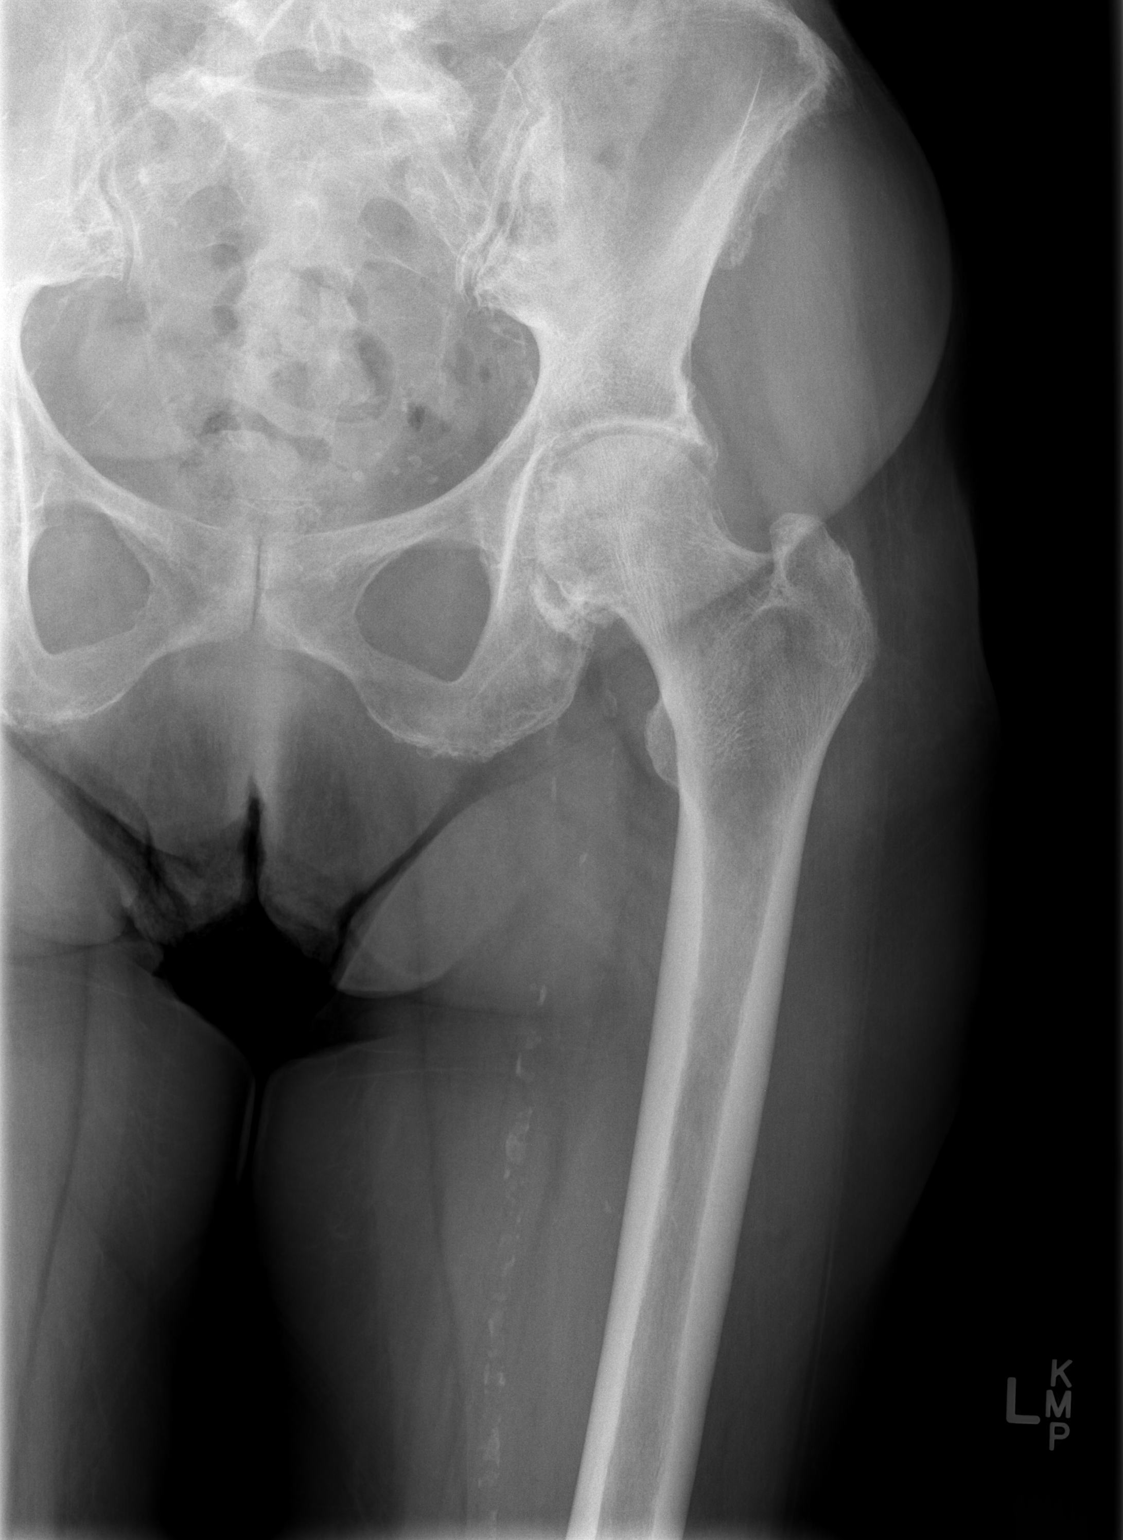

[t hip frog leg left]
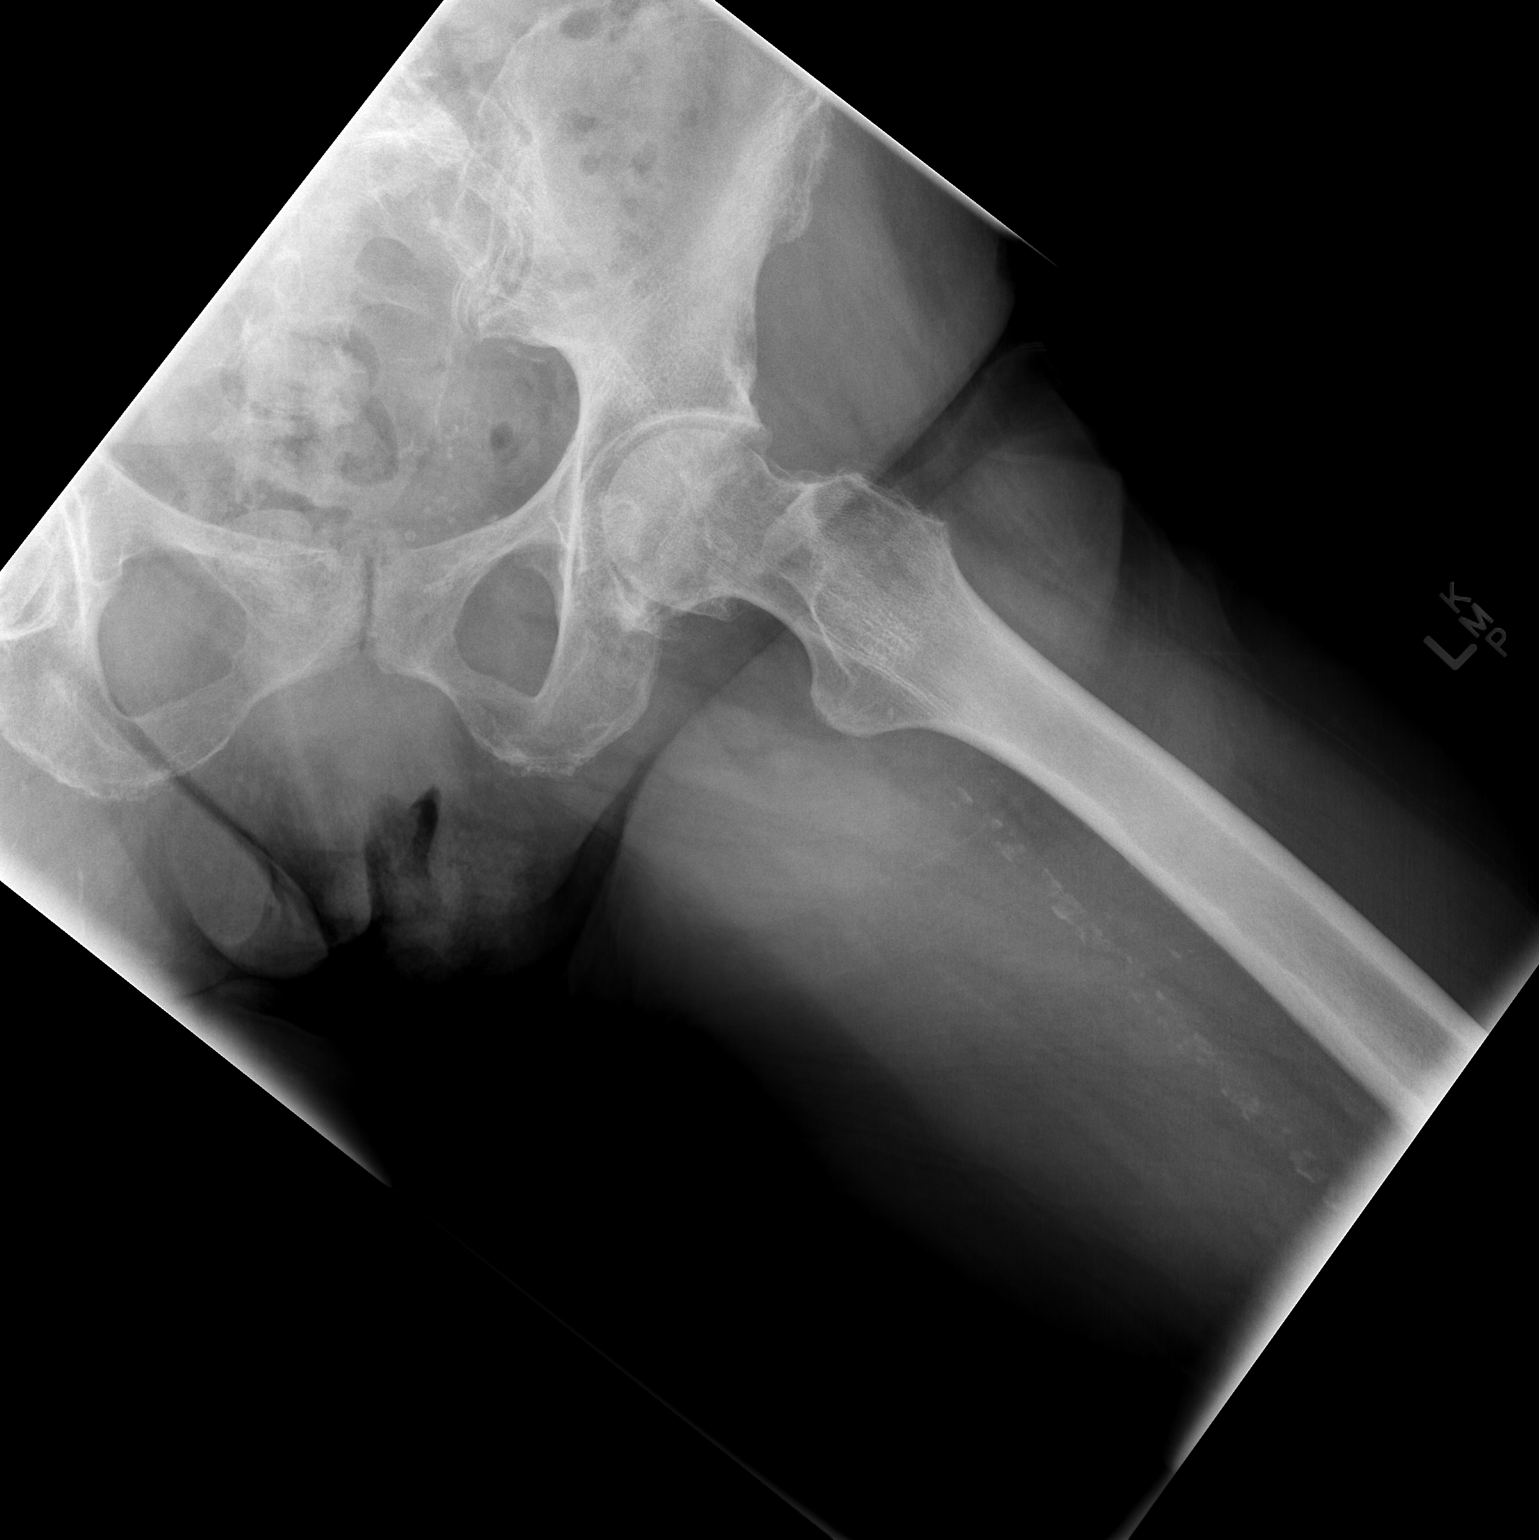

[2 of 2 positions shown; findings below may reference images not displayed]

FINDINGS: Mild-to-moderate femoral head and neck junction spur formation. No
fracture or dislocation seen. Atheromatous arterial calcifications.
IMPRESSION: 1. No fracture or dislocation.
2. Mild to moderate degenerative spur formation.  The

## 2017-10-04 ENCOUNTER — Ambulatory Visit: Payer: Medicare HMO | Admitting: Podiatry

## 2017-10-04 ENCOUNTER — Encounter: Payer: Self-pay | Admitting: Podiatry

## 2017-10-04 VITALS — BP 130/81 | HR 77 | Ht 61.5 in | Wt 135.0 lb

## 2017-10-04 DIAGNOSIS — M79676 Pain in unspecified toe(s): Secondary | ICD-10-CM | POA: Diagnosis not present

## 2017-10-04 DIAGNOSIS — M79609 Pain in unspecified limb: Principal | ICD-10-CM

## 2017-10-04 DIAGNOSIS — B351 Tinea unguium: Secondary | ICD-10-CM | POA: Diagnosis not present

## 2017-10-04 NOTE — Progress Notes (Signed)
   Subjective:    Patient ID: Dorothy Noble, female    DOB: 1922-06-28, 84 y.o.   MRN: [id number]  HPI  Chief Complaint  Patient presents with  . Nail Problem    (NP) great toenail is thick and painful       Review of Systems  HENT: Positive for sinus pressure.   Eyes: Positive for visual disturbance.  Respiratory: Positive for shortness of breath.   Cardiovascular: Positive for leg swelling.  Gastrointestinal: Positive for constipation.  Musculoskeletal: Positive for arthralgias, back pain and gait problem.  Neurological: Positive for numbness.  Hematological: Bruises/bleeds easily.  Psychiatric/Behavioral: Positive for confusion.  All other systems reviewed and are negative.      Objective:   Physical Exam        Assessment & Plan:

## 2017-10-18 ENCOUNTER — Ambulatory Visit
Admission: RE | Admit: 2017-10-18 | Discharge: 2017-10-18 | Disposition: A | Discharge status: Home / Self Care | Payer: Medicare HMO | Source: Ambulatory Visit | Attending: Physician Assistant | Admitting: Physician Assistant

## 2017-10-18 ENCOUNTER — Other Ambulatory Visit: Payer: Self-pay | Admitting: Physician Assistant

## 2017-10-18 DIAGNOSIS — M79642 Pain in left hand: Secondary | ICD-10-CM | POA: Diagnosis not present

## 2017-10-18 DIAGNOSIS — M19032 Primary osteoarthritis, left wrist: Secondary | ICD-10-CM | POA: Diagnosis not present

## 2017-10-18 DIAGNOSIS — M25532 Pain in left wrist: Secondary | ICD-10-CM

## 2017-11-20 NOTE — Progress Notes (Signed)
  Subjective:  Patient ID: Dorothy Noble, female    DOB: Oct 22, 1921,  MRN: [id number]  Chief Complaint  Patient presents with  . Nail Problem    (NP) great toenail is thick and painful   84 y.o. female returns for the above complaint.  Reports a painful lesion along the thickened toenails to both feet.  States that the left toenail is especially painful. Denies DM.  Past Medical History:  Diagnosis Date  . Arthritis   . Carotid artery occlusion   . Herniated disc   . Hypertension   . Stroke [facility])    Past Surgical History:  Procedure Laterality Date  . Blood clot removed from R ankle    . Carotid endarderectomy    . CAROTID ENDARTERECTOMY Right 05-14-09   cea    Current Outpatient Medications:  .  aspirin EC 81 MG tablet, Take 81 mg by mouth daily.  , Disp: , Rfl:  .  BESIVANCE 0.6 % SUSP, Place 1 drop into the left eye 4 (four) times daily. 2 days after each monthly eye injection, Disp: , Rfl: 12 .  doxycycline (VIBRAMYCIN) 100 MG capsule, Take 1 capsule by mouth every 12 (twelve) hours., Disp: , Rfl: 0 .  feeding supplement, ENSURE ENLIVE, (ENSURE ENLIVE) LIQD, Take 237 mLs by mouth 3 (three) times daily between meals., Disp: 237 mL, Rfl: 12 .  hydrALAZINE (APRESOLINE) 25 MG tablet, Take 1 tablet (25 mg total) by mouth 3 (three) times daily., Disp: 90 tablet, Rfl: 0 .  megestrol (MEGACE) 400 MG/10ML suspension, Take 10 mLs (400 mg total) by mouth daily., Disp: 240 mL, Rfl: 0 .  Multiple Vitamins-Minerals (MULTIVITAMINS THER. W/MINERALS) TABS, Take 1 tablet by mouth daily.  , Disp: , Rfl:  .  Omega-3 Fatty Acids (FISH OIL) 1000 MG CAPS, Take by mouth 3 (three) times daily., Disp: , Rfl:  .  Red Yeast Rice Extract (RED YEAST RICE PO), Take 2 tablets by mouth at bedtime., Disp: , Rfl:   No Known Allergies   Objective:   Vitals:   10/04/17 1550  BP: 130/81  Pulse: 77   General AA&O x3. Normal mood and affect.  Vascular Pedal pulses palpable.  Neurologic Epicritic sensation  grossly intact.  Dermatologic No open lesions. Skin normal texture and turgor. Toenails x 10 elongated, thickened, dystrophic.  Orthopedic: Pain to palpation about the toenails.   Assessment & Plan:  Patient was evaluated and treated and all questions answered.  Onychomycosis with pain  -Nails palliatively debrided as below. -Educated on self-care  Procedure: Nail Debridement Rationale: pain  Type of Debridement: manual, sharp debridement. Instrumentation: Nail nipper, rotary burr. Number of Nails: 10   Return if symptoms worsen or fail to improve.

## 2017-12-12 DIAGNOSIS — K08109 Complete loss of teeth, unspecified cause, unspecified class: Secondary | ICD-10-CM | POA: Diagnosis not present

## 2017-12-12 DIAGNOSIS — Z7982 Long term (current) use of aspirin: Secondary | ICD-10-CM | POA: Diagnosis not present

## 2017-12-12 DIAGNOSIS — I10 Essential (primary) hypertension: Secondary | ICD-10-CM | POA: Diagnosis not present

## 2017-12-12 DIAGNOSIS — H547 Unspecified visual loss: Secondary | ICD-10-CM | POA: Diagnosis not present

## 2017-12-12 DIAGNOSIS — I739 Peripheral vascular disease, unspecified: Secondary | ICD-10-CM | POA: Diagnosis not present

## 2018-02-05 DIAGNOSIS — N183 Chronic kidney disease, stage 3 (moderate): Secondary | ICD-10-CM | POA: Diagnosis not present

## 2018-02-05 DIAGNOSIS — I129 Hypertensive chronic kidney disease with stage 1 through stage 4 chronic kidney disease, or unspecified chronic kidney disease: Secondary | ICD-10-CM | POA: Diagnosis not present

## 2018-02-05 DIAGNOSIS — I779 Disorder of arteries and arterioles, unspecified: Secondary | ICD-10-CM | POA: Diagnosis not present

## 2018-02-05 DIAGNOSIS — E039 Hypothyroidism, unspecified: Secondary | ICD-10-CM | POA: Diagnosis not present

## 2018-02-05 DIAGNOSIS — I5030 Unspecified diastolic (congestive) heart failure: Secondary | ICD-10-CM | POA: Diagnosis not present

## 2018-02-05 DIAGNOSIS — E7800 Pure hypercholesterolemia, unspecified: Secondary | ICD-10-CM | POA: Diagnosis not present

## 2018-02-21 ENCOUNTER — Ambulatory Visit: Payer: Medicare HMO | Admitting: Podiatry

## 2018-02-21 DIAGNOSIS — M79609 Pain in unspecified limb: Secondary | ICD-10-CM | POA: Diagnosis not present

## 2018-02-21 DIAGNOSIS — M79676 Pain in unspecified toe(s): Secondary | ICD-10-CM | POA: Diagnosis not present

## 2018-02-21 DIAGNOSIS — B351 Tinea unguium: Secondary | ICD-10-CM | POA: Diagnosis not present

## 2018-02-21 NOTE — Progress Notes (Signed)
  Subjective:  Patient ID: Dorothy Noble, female    DOB: 1922/09/14,  MRN: [id number]  Chief Complaint  Patient presents with  . Nail Problem    bilateral thick painful toenails   84 y.o. female returns for the above complaint. Reports thickened toenails she cannot care for herself. Calluses on the end of some of her toes as well.  Objective:  There were no vitals filed for this visit. General AA&O x3. Normal mood and affect.  Vascular Pedal pulses palpable.  Neurologic Epicritic sensation grossly intact.  Dermatologic No open lesions. Skin normal texture and turgor. Toenails x 10 elongated, thickened, dystrophic. Calluses distal 2nd/3rd toenails bilat.  Orthopedic: Pain to palpation about the toenails.   Assessment & Plan:  Patient was evaluated and treated and all questions answered.  Onychomycosis with pain  -Nails palliatively debrided as below. -Educated on self-care  Procedure: Nail Debridement Rationale: pain  Type of Debridement: manual, sharp debridement. Instrumentation: Nail nipper, rotary burr. Number of Nails: 10   No follow-ups on file.

## 2018-07-03 DIAGNOSIS — H10413 Chronic giant papillary conjunctivitis, bilateral: Secondary | ICD-10-CM | POA: Diagnosis not present

## 2018-07-03 DIAGNOSIS — H353124 Nonexudative age-related macular degeneration, left eye, advanced atrophic with subfoveal involvement: Secondary | ICD-10-CM | POA: Diagnosis not present

## 2018-07-03 DIAGNOSIS — H348122 Central retinal vein occlusion, left eye, stable: Secondary | ICD-10-CM | POA: Diagnosis not present

## 2018-07-03 DIAGNOSIS — H353111 Nonexudative age-related macular degeneration, right eye, early dry stage: Secondary | ICD-10-CM | POA: Diagnosis not present

## 2018-07-03 DIAGNOSIS — Z961 Presence of intraocular lens: Secondary | ICD-10-CM | POA: Diagnosis not present

## 2018-07-03 DIAGNOSIS — H04123 Dry eye syndrome of bilateral lacrimal glands: Secondary | ICD-10-CM | POA: Diagnosis not present

## 2018-07-12 DIAGNOSIS — Z23 Encounter for immunization: Secondary | ICD-10-CM | POA: Diagnosis not present

## 2018-07-19 ENCOUNTER — Ambulatory Visit: Payer: Medicare HMO | Admitting: Podiatry

## 2018-08-01 ENCOUNTER — Encounter

## 2018-08-01 ENCOUNTER — Ambulatory Visit: Payer: Medicare HMO | Admitting: Podiatry

## 2018-08-01 DIAGNOSIS — L853 Xerosis cutis: Secondary | ICD-10-CM | POA: Diagnosis not present

## 2018-08-19 DIAGNOSIS — R69 Illness, unspecified: Secondary | ICD-10-CM | POA: Diagnosis not present

## 2018-08-19 DIAGNOSIS — Z0000 Encounter for general adult medical examination without abnormal findings: Secondary | ICD-10-CM | POA: Diagnosis not present

## 2018-08-19 DIAGNOSIS — E039 Hypothyroidism, unspecified: Secondary | ICD-10-CM | POA: Diagnosis not present

## 2018-08-19 DIAGNOSIS — J309 Allergic rhinitis, unspecified: Secondary | ICD-10-CM | POA: Diagnosis not present

## 2018-08-19 DIAGNOSIS — R413 Other amnesia: Secondary | ICD-10-CM | POA: Diagnosis not present

## 2018-08-19 DIAGNOSIS — I779 Disorder of arteries and arterioles, unspecified: Secondary | ICD-10-CM | POA: Diagnosis not present

## 2018-08-19 DIAGNOSIS — I5030 Unspecified diastolic (congestive) heart failure: Secondary | ICD-10-CM | POA: Diagnosis not present

## 2018-08-19 DIAGNOSIS — M1990 Unspecified osteoarthritis, unspecified site: Secondary | ICD-10-CM | POA: Diagnosis not present

## 2018-08-19 DIAGNOSIS — I129 Hypertensive chronic kidney disease with stage 1 through stage 4 chronic kidney disease, or unspecified chronic kidney disease: Secondary | ICD-10-CM | POA: Diagnosis not present

## 2018-08-19 DIAGNOSIS — K219 Gastro-esophageal reflux disease without esophagitis: Secondary | ICD-10-CM | POA: Diagnosis not present

## 2018-08-24 NOTE — Progress Notes (Signed)
  Subjective:  Patient ID: Dorothy Noble, female    DOB: January 20, 1922,  MRN: [id number]  Chief Complaint  Patient presents with  . Nail Problem    5 month nail trim  . Callouses    bilateral callus trim    84 y.o. female presents with the above complaint.  Reports severe dryness to both feet.  Has tried everything over-the-counter   Review of Systems: Negative except as noted in the HPI. Denies N/V/F/Ch.  Past Medical History:  Diagnosis Date  . Arthritis   . Carotid artery occlusion   . Herniated disc   . Hypertension   . Stroke [facility])     Current Outpatient Medications:  .  aspirin EC 81 MG tablet, Take 81 mg by mouth daily.  , Disp: , Rfl:  .  BESIVANCE 0.6 % SUSP, Place 1 drop into the left eye 4 (four) times daily. 2 days after each monthly eye injection, Disp: , Rfl: 12 .  doxycycline (VIBRAMYCIN) 100 MG capsule, Take 1 capsule by mouth every 12 (twelve) hours., Disp: , Rfl: 0 .  feeding supplement, ENSURE ENLIVE, (ENSURE ENLIVE) LIQD, Take 237 mLs by mouth 3 (three) times daily between meals., Disp: 237 mL, Rfl: 12 .  hydrALAZINE (APRESOLINE) 25 MG tablet, Take 1 tablet (25 mg total) by mouth 3 (three) times daily., Disp: 90 tablet, Rfl: 0 .  megestrol (MEGACE) 400 MG/10ML suspension, Take 10 mLs (400 mg total) by mouth daily., Disp: 240 mL, Rfl: 0 .  Multiple Vitamins-Minerals (MULTIVITAMINS THER. W/MINERALS) TABS, Take 1 tablet by mouth daily.  , Disp: , Rfl:  .  Omega-3 Fatty Acids (FISH OIL) 1000 MG CAPS, Take by mouth 3 (three) times daily., Disp: , Rfl:  .  Red Yeast Rice Extract (RED YEAST RICE PO), Take 2 tablets by mouth at bedtime., Disp: , Rfl:   Social History   Tobacco Use  Smoking Status Never Smoker  Smokeless Tobacco Never Used    No Known Allergies Objective:  There were no vitals filed for this visit. There is no height or weight on file to calculate BMI. Constitutional Well developed. Well nourished.  Vascular Dorsalis pedis pulses palpable  bilaterally. Posterior tibial pulses palpable bilaterally. Capillary refill normal to all digits.  No cyanosis or clubbing noted. Pedal hair growth normal.  Neurologic Normal speech. Oriented to person, place, and time. Epicritic sensation to light touch grossly present bilaterally.  Dermatologic Severe xerosis bilateral feet.  Orthopedic: Normal joint ROM without pain or crepitus bilaterally. No visible deformities. No bony tenderness.   Radiographs: none Assessment:   1. Xerosis cutis    Plan:  Patient was evaluated and treated and all questions answered.  Xerosis cutis -Educated on etiology -Discussed use of over-the-counter emollients.  No follow-ups on file.

## 2018-11-13 ENCOUNTER — Telehealth: Payer: Self-pay | Admitting: *Deleted

## 2018-11-13 NOTE — Telephone Encounter (Signed)
"  My mother needs to have surgery.  So I am calling to schedule it.  Dr. March Rummage said she needs a Tenotomy to prevent the corn from coming back."  I can't schedule the surgery yet.  She needs to see Dr. March Rummage for a consultation then we can get her scheduled.  "We have to go through all that?  We can't just schedule the surgery."  Dr. March Rummage needs to do consultation in order for the patient to sign the consent forms, get surgical kit, and other pertinent information about the surgery.  "When can we make an appointment?"  Let me transfer you to an appointment scheduler.  I transferred her to [place].

## 2018-11-14 ENCOUNTER — Telehealth: Payer: Self-pay | Admitting: *Deleted

## 2018-11-14 NOTE — Telephone Encounter (Signed)
"  Ms. Dorothy Noble would you give me a call.  It's for [employer].  She has an appointment on March 5 at 2:45pm."

## 2018-11-20 NOTE — Telephone Encounter (Signed)
I attempted to call Dorothy Noble back.  I want to move her appointment from 2:45 pm to 7:45 am on 11/28/2018 for surgery.  I left her a message to call me back.

## 2018-11-20 NOTE — Telephone Encounter (Signed)
I left Ms. Dorothy Noble a message that I found the paperwork for her [name]'s surgery.  I am changing her time on March 5 to 7:45 am.  He will do her procedure at the time.  She will need to sign her consent form at that time prior to her surgical procedure.  I asked her to call me back and let me know if this is okay.

## 2018-11-25 ENCOUNTER — Other Ambulatory Visit: Payer: Self-pay

## 2018-11-25 ENCOUNTER — Encounter (HOSPITAL_COMMUNITY): Payer: Self-pay | Admitting: Emergency Medicine

## 2018-11-25 ENCOUNTER — Emergency Department (HOSPITAL_COMMUNITY): Payer: Medicare HMO

## 2018-11-25 ENCOUNTER — Emergency Department (HOSPITAL_COMMUNITY)
Admission: EM | Admit: 2018-11-25 | Discharge: 2018-11-25 | Disposition: A | Discharge status: Home / Self Care | Payer: Medicare HMO | Attending: Emergency Medicine | Admitting: Emergency Medicine

## 2018-11-25 DIAGNOSIS — M542 Cervicalgia: Secondary | ICD-10-CM | POA: Diagnosis not present

## 2018-11-25 DIAGNOSIS — M25559 Pain in unspecified hip: Secondary | ICD-10-CM

## 2018-11-25 DIAGNOSIS — R531 Weakness: Secondary | ICD-10-CM | POA: Diagnosis not present

## 2018-11-25 DIAGNOSIS — R350 Frequency of micturition: Secondary | ICD-10-CM | POA: Diagnosis not present

## 2018-11-25 DIAGNOSIS — M25519 Pain in unspecified shoulder: Secondary | ICD-10-CM | POA: Diagnosis not present

## 2018-11-25 DIAGNOSIS — M25561 Pain in right knee: Secondary | ICD-10-CM | POA: Insufficient documentation

## 2018-11-25 DIAGNOSIS — S00531A Contusion of lip, initial encounter: Secondary | ICD-10-CM | POA: Diagnosis not present

## 2018-11-25 DIAGNOSIS — S3993XA Unspecified injury of pelvis, initial encounter: Secondary | ICD-10-CM | POA: Diagnosis not present

## 2018-11-25 DIAGNOSIS — M25551 Pain in right hip: Secondary | ICD-10-CM | POA: Insufficient documentation

## 2018-11-25 DIAGNOSIS — Y999 Unspecified external cause status: Secondary | ICD-10-CM | POA: Diagnosis not present

## 2018-11-25 DIAGNOSIS — Y9389 Activity, other specified: Secondary | ICD-10-CM | POA: Insufficient documentation

## 2018-11-25 DIAGNOSIS — W19XXXA Unspecified fall, initial encounter: Secondary | ICD-10-CM | POA: Insufficient documentation

## 2018-11-25 DIAGNOSIS — Z79899 Other long term (current) drug therapy: Secondary | ICD-10-CM | POA: Insufficient documentation

## 2018-11-25 DIAGNOSIS — S199XXA Unspecified injury of neck, initial encounter: Secondary | ICD-10-CM | POA: Diagnosis not present

## 2018-11-25 DIAGNOSIS — S43014A Anterior dislocation of right humerus, initial encounter: Secondary | ICD-10-CM | POA: Diagnosis not present

## 2018-11-25 DIAGNOSIS — M25552 Pain in left hip: Secondary | ICD-10-CM | POA: Diagnosis not present

## 2018-11-25 DIAGNOSIS — Z7982 Long term (current) use of aspirin: Secondary | ICD-10-CM | POA: Insufficient documentation

## 2018-11-25 DIAGNOSIS — S0081XA Abrasion of other part of head, initial encounter: Secondary | ICD-10-CM | POA: Diagnosis not present

## 2018-11-25 DIAGNOSIS — S4991XA Unspecified injury of right shoulder and upper arm, initial encounter: Secondary | ICD-10-CM | POA: Diagnosis present

## 2018-11-25 DIAGNOSIS — Y92009 Unspecified place in unspecified non-institutional (private) residence as the place of occurrence of the external cause: Secondary | ICD-10-CM | POA: Insufficient documentation

## 2018-11-25 DIAGNOSIS — S0993XA Unspecified injury of face, initial encounter: Secondary | ICD-10-CM | POA: Diagnosis not present

## 2018-11-25 DIAGNOSIS — S0990XA Unspecified injury of head, initial encounter: Secondary | ICD-10-CM | POA: Diagnosis not present

## 2018-11-25 DIAGNOSIS — M79603 Pain in arm, unspecified: Secondary | ICD-10-CM | POA: Diagnosis not present

## 2018-11-25 DIAGNOSIS — R609 Edema, unspecified: Secondary | ICD-10-CM | POA: Diagnosis not present

## 2018-11-25 DIAGNOSIS — S43004A Unspecified dislocation of right shoulder joint, initial encounter: Secondary | ICD-10-CM | POA: Diagnosis not present

## 2018-11-25 DIAGNOSIS — I10 Essential (primary) hypertension: Secondary | ICD-10-CM | POA: Insufficient documentation

## 2018-11-25 DIAGNOSIS — S00532A Contusion of oral cavity, initial encounter: Secondary | ICD-10-CM | POA: Diagnosis not present

## 2018-11-25 DIAGNOSIS — S8991XA Unspecified injury of right lower leg, initial encounter: Secondary | ICD-10-CM | POA: Diagnosis not present

## 2018-11-25 DIAGNOSIS — R52 Pain, unspecified: Secondary | ICD-10-CM | POA: Diagnosis not present

## 2018-11-25 DIAGNOSIS — R51 Headache: Secondary | ICD-10-CM | POA: Diagnosis not present

## 2018-11-25 LAB — CBC WITH DIFFERENTIAL/PLATELET
Abs Immature Granulocytes: 0.04 10*3/uL (ref 0.00–0.07)
BASOS PCT: 0 %
Basophils Absolute: 0 10*3/uL (ref 0.0–0.1)
EOS ABS: 0 10*3/uL (ref 0.0–0.5)
EOS PCT: 0 %
HCT: 44.3 % (ref 36.0–46.0)
HEMOGLOBIN: 14.2 g/dL (ref 12.0–15.0)
Immature Granulocytes: 1 %
LYMPHS PCT: 6 %
Lymphs Abs: 0.5 10*3/uL — ABNORMAL LOW (ref 0.7–4.0)
MCH: 31.7 pg (ref 26.0–34.0)
MCHC: 32.1 g/dL (ref 30.0–36.0)
MCV: 98.9 fL (ref 80.0–100.0)
MONO ABS: 0.3 10*3/uL (ref 0.1–1.0)
Monocytes Relative: 3 %
Neutro Abs: 8 10*3/uL — ABNORMAL HIGH (ref 1.7–7.7)
Neutrophils Relative %: 90 %
Platelets: 179 10*3/uL (ref 150–400)
RBC: 4.48 MIL/uL (ref 3.87–5.11)
RDW: 13.9 % (ref 11.5–15.5)
WBC: 8.8 10*3/uL (ref 4.0–10.5)
nRBC: 0 % (ref 0.0–0.2)

## 2018-11-25 LAB — COMPREHENSIVE METABOLIC PANEL
ALK PHOS: 81 U/L (ref 38–126)
ALT: 18 U/L (ref 0–44)
AST: 26 U/L (ref 15–41)
Albumin: 4.5 g/dL (ref 3.5–5.0)
Anion gap: 10 (ref 5–15)
BILIRUBIN TOTAL: 0.4 mg/dL (ref 0.3–1.2)
BUN: 41 mg/dL — ABNORMAL HIGH (ref 8–23)
CALCIUM: 9.9 mg/dL (ref 8.9–10.3)
CO2: 25 mmol/L (ref 22–32)
CREATININE: 1.76 mg/dL — AB (ref 0.44–1.00)
Chloride: 105 mmol/L (ref 98–111)
GFR calc Af Amer: 28 mL/min — ABNORMAL LOW (ref >60)
GFR, EST NON AFRICAN AMERICAN: 24 mL/min — AB (ref >60)
Glucose, Bld: 127 mg/dL — ABNORMAL HIGH (ref 70–99)
Potassium: 4.9 mmol/L (ref 3.5–5.1)
Sodium: 140 mmol/L (ref 135–145)
TOTAL PROTEIN: 8.1 g/dL (ref 6.5–8.1)

## 2018-11-25 LAB — URINALYSIS, ROUTINE W REFLEX MICROSCOPIC
BILIRUBIN URINE: NEGATIVE
Glucose, UA: NEGATIVE mg/dL
HGB URINE DIPSTICK: NEGATIVE
Ketones, ur: NEGATIVE mg/dL
Leukocytes,Ua: NEGATIVE
Nitrite: NEGATIVE
Protein, ur: NEGATIVE mg/dL
SPECIFIC GRAVITY, URINE: 1.009 (ref 1.005–1.030)
pH: 8 (ref 5.0–8.0)

## 2018-11-25 LAB — I-STAT TROPONIN, ED: TROPONIN I, POC: 0.04 ng/mL (ref 0.00–0.08)

## 2018-11-25 MED ORDER — SODIUM CHLORIDE 0.9 % IV BOLUS
500.0000 mL | Freq: Once | INTRAVENOUS | Status: AC
  Administered 2018-11-25: 500 mL via INTRAVENOUS

## 2018-11-25 MED ORDER — FENTANYL CITRATE (PF) 100 MCG/2ML IJ SOLN
50.0000 ug | Freq: Once | INTRAMUSCULAR | Status: AC
  Administered 2018-11-25: 50 ug via INTRAVENOUS
  Filled 2018-11-25: qty 2

## 2018-11-25 NOTE — ED Provider Notes (Signed)
[facility] DEPT Provider Note   CSN: [id number] Arrival date & time: 11/25/18  0601    History   Chief Complaint Chief Complaint  Patient presents with  . Fall    HPI Dorothy Noble is a 85 y.o. female with a PMH of Arthritis, HTN, Carotid artery occlusion, herniated disk, and stroke presenting after an unwitnessed fall onset 1 hour ago. Patient arrived via EMS from home where she lives with daughter. Patient reports she was going to the bathroom when she may have tripped and fell forward. Patient is not sure what made her fall. Patient states she walks with a walker at home. Patient denies taking blood thinners. Patient is not sure if she hit her head or LOC. Patient reports intermittent sharp right anterior shoulder pain that radiates down her arm. Patient states pain is worse with movement. Patient denies numbness, paresthesias, or weakness. Patient reports urinary frequency, but denies dysuria. Patient also reports neck pain, bilateral hip pain, and right knee anterior knee pain. Patient reports a small bruise on her lip and an abrasion on her chin. Patient denies shortness of breath or chest pain. Patient denies nausea, vomiting, or abdominal pain. Patient denies taking any medications today. Patient denies fever, chills, cough, congestion, rhinorrhea, or sick exposures.      HPI  Past Medical History:  Diagnosis Date  . Arthritis   . Carotid artery occlusion   . Herniated disc   . Hypertension   . Stroke [facility])     Patient Active Problem List   Diagnosis Date Noted  . Malnutrition of moderate degree ([place]) 06/26/2015  . Weakness 06/24/2015  . Numbness- Right Hand 01/22/2014  . Chest pain 01/22/2014  . Occlusion and stenosis of carotid artery without mention of cerebral infarction 02/10/2013    Past Surgical History:  Procedure Laterality Date  . Blood clot removed from R ankle    . Carotid endarderectomy    . CAROTID ENDARTERECTOMY Right  05-14-09   cea     OB History   No obstetric history on file.      Home Medications    Prior to Admission medications   Medication Sig Start Date End Date Taking? Authorizing Provider  aspirin EC 81 MG tablet Take 81 mg by mouth daily.     Yes [provider]  hydrALAZINE (APRESOLINE) 25 MG tablet Take 1 tablet (25 mg total) by mouth 3 (three) times daily. 06/26/15  Yes Elgergawy, Silver Huguenin, MD  Multiple Vitamins-Minerals (ICAPS AREDS 2 PO) Take 1 tablet by mouth 2 (two) times daily.   Yes [provider]  Omega-3 Fatty Acids (FISH OIL) 1000 MG CAPS Take by mouth 3 (three) times daily.   Yes [provider]  Red Yeast Rice Extract (RED YEAST RICE PO) Take 2 tablets by mouth at bedtime.   Yes [provider]  feeding supplement, ENSURE ENLIVE, (ENSURE ENLIVE) LIQD Take 237 mLs by mouth 3 (three) times daily between meals. Patient not taking: Reported on 11/25/2018 06/26/15   Elgergawy, Silver Huguenin, MD  megestrol (MEGACE) 400 MG/10ML suspension Take 10 mLs (400 mg total) by mouth daily. Patient not taking: Reported on 11/25/2018 06/26/15   Elgergawy, Silver Huguenin, MD    Family History Family History  Problem Relation Age of Onset  . Hyperlipidemia Daughter   . Hypertension Daughter   . Other Daughter        varicose veins  . Varicose Veins Daughter   . Cancer Sister  Breast  . Hyperlipidemia Sister   . Hypertension Sister   . Hypertension Daughter   . Hyperlipidemia Daughter     Social History Social History   Tobacco Use  . Smoking status: Never Smoker  . Smokeless tobacco: Never Used  Substance Use Topics  . Alcohol use: No  . Drug use: No     Allergies   Patient has no known allergies.   Review of Systems Review of Systems  Constitutional: Negative for activity change, appetite change, chills, diaphoresis, fatigue, fever and unexpected weight change.  HENT: Negative for congestion, rhinorrhea and sore throat.   Eyes: Negative for  photophobia and visual disturbance.  Respiratory: Negative for cough, chest tightness and shortness of breath.   Cardiovascular: Negative for chest pain, palpitations and leg swelling.  Gastrointestinal: Negative for abdominal pain, blood in stool, diarrhea, nausea and vomiting.  Endocrine: Negative for cold intolerance and heat intolerance.  Genitourinary: Positive for frequency. Negative for difficulty urinating and dysuria.  Musculoskeletal: Positive for arthralgias and neck pain. Negative for myalgias.  Skin: Positive for color change and wound.  Neurological: Negative for dizziness, tremors, seizures, syncope, facial asymmetry, speech difficulty, weakness, light-headedness, numbness and headaches.  Psychiatric/Behavioral: Negative for confusion, dysphoric mood and sleep disturbance. The patient is not nervous/anxious.     Physical Exam Updated Vital Signs BP (!) 185/70 (BP Location: Left Arm)   Pulse 72   Temp 98.7 F (37.1 C) (Oral)   Resp 16   Ht 5\' 4"  (1.626 m)   Wt 61.2 kg   SpO2 99%   BMI 23.17 kg/m   Physical Exam Vitals signs and nursing note reviewed.  Constitutional:      General: She is not in acute distress.    Appearance: She is well-developed. She is not diaphoretic.  HENT:     Head: Normocephalic and atraumatic. No raccoon eyes or Battle's sign.     Right Ear: Tympanic membrane, ear canal and external ear normal.     Left Ear: Tympanic membrane, ear canal and external ear normal.     Nose: Nose normal.     Mouth/Throat:     Mouth: Mucous membranes are moist. Injury (Bruising and edema noted on right lower lip.), lacerations (Small abrasion noted on chin. ) and oral lesions (Small hematoma noted on tip of tongue. ) present.     Pharynx: No oropharyngeal exudate or posterior oropharyngeal erythema.  Eyes:     Extraocular Movements: Extraocular movements intact.     Conjunctiva/sclera: Conjunctivae normal.     Pupils: Pupils are equal, round, and reactive to  light.  Neck:     Musculoskeletal: Decreased range of motion. Spinous process tenderness present. No edema, erythema or muscular tenderness.  Cardiovascular:     Rate and Rhythm: Normal rate and regular rhythm.     Heart sounds: Normal heart sounds. No murmur. No friction rub. No gallop.   Pulmonary:     Effort: Pulmonary effort is normal. No respiratory distress.     Breath sounds: Normal breath sounds. No wheezing or rales.  Abdominal:     General: There is no distension.     Palpations: Abdomen is soft.     Tenderness: There is no abdominal tenderness.  Musculoskeletal:     Right shoulder: She exhibits decreased range of motion, tenderness and bony tenderness.     Left shoulder: Normal. She exhibits normal range of motion, no tenderness and no bony tenderness.     Right elbow: Normal.She exhibits normal range of  motion, no swelling and no effusion.     Right wrist: Normal. She exhibits normal range of motion, no tenderness and no bony tenderness.     Right hip: She exhibits tenderness and bony tenderness. She exhibits normal range of motion and normal strength.     Left hip: She exhibits tenderness and bony tenderness. She exhibits normal range of motion and normal strength.     Right knee: She exhibits swelling, ecchymosis and bony tenderness (Anterior knee tenderness to palpation.). She exhibits normal range of motion and no deformity. Tenderness found. No medial joint line and no lateral joint line tenderness noted.     Right ankle: Normal. She exhibits normal range of motion, no swelling and no ecchymosis.     Cervical back: She exhibits decreased range of motion, tenderness and bony tenderness. She exhibits no swelling and no edema.     Thoracic back: Normal. She exhibits normal range of motion, no tenderness and no bony tenderness.     Lumbar back: Normal. She exhibits normal range of motion, no tenderness and no bony tenderness.  Skin:    General: Skin is warm.     Findings: No  abrasion, bruising, erythema or rash.  Neurological:     Mental Status: She is alert.    Mental Status:  Alert and oriented to person and place. Not oriented to time. Thought content appropriate, able to give a coherent history. Speech fluent without evidence of aphasia. Able to follow 2 step commands without difficulty.   Cranial Nerves:  II:  Peripheral visual fields grossly normal, pupils equal, round, reactive to light III,IV, VI: ptosis not present, extra-ocular motions intact bilaterally  V,VII: smile symmetric, facial light touch sensation equal VIII: hearing grossly normal to voice  X: uvula elevates symmetrically  XI: bilateral shoulder shrug symmetric and strong XII: midline tongue extension without fassiculations Motor:  Normal tone. 5/5 in upper and lower extremities bilaterally including strong and equal grip strength and dorsiflexion/plantar flexion Sensory: light touch normal in all extremities.  Deep Tendon Reflexes: 2+ and symmetric in the biceps and patella Cerebellar: unable to assess finger-to-nose with bilateral upper extremities due to right shoulder pain. Gait: unable to ambulate due to arm restriction. CV: distal pulses palpable throughout   ED Treatments / Results  Labs (all labs ordered are listed, but only abnormal results are displayed) Labs Reviewed  COMPREHENSIVE METABOLIC PANEL - Abnormal; Notable for the following components:      Result Value   Glucose, Bld 127 (*)    BUN 41 (*)    Creatinine, Ser 1.76 (*)    GFR calc non Af Amer 24 (*)    GFR calc Af Amer 28 (*)    All other components within normal limits  CBC WITH DIFFERENTIAL/PLATELET - Abnormal; Notable for the following components:   Neutro Abs 8.0 (*)    Lymphs Abs 0.5 (*)    All other components within normal limits  URINALYSIS, ROUTINE W REFLEX MICROSCOPIC - Abnormal; Notable for the following components:   Color, Urine STRAW (*)    All other components within normal limits  I-STAT  TROPONIN, ED    EKG EKG Interpretation  Date/Time:  Monday November 25 2018 08:06:45 EST Ventricular Rate:  86 PR Interval:    QRS Duration: 82 QT Interval:  374 QTC Calculation: 448 R Axis:   65 Text Interpretation:  Sinus rhythm Atrial premature complex Repol abnrm suggests ischemia, anterolateral T wave changes similar to 2016 Confirmed by Sherwood Gambler [phone])  on 11/25/2018 8:17:51 AM Also confirmed by Sherwood Gambler [phone]), editor Hattie Perch [phone])  on 11/25/2018 1:27:30 PM   Radiology Dg Shoulder Right  Result Date: 11/25/2018 CLINICAL DATA:  Fall. EXAM: RIGHT SHOULDER - 2+ VIEW COMPARISON:  09/03/2011 FINDINGS: Anterior glenohumeral dislocation. Rounded calcified density projecting near the glenoid is likely related to the rotator cuff when compared to prior. No convincing acute fracture. Inferior acromion spurring. Osteopenia. IMPRESSION: Anterior glenohumeral dislocation. Electronically Signed   By: Monte Fantasia M.D.   On: 11/25/2018 07:07   Ct Head Wo Contrast  Result Date: 11/25/2018 CLINICAL DATA:  Fall with headache and neck pain. EXAM: CT HEAD WITHOUT CONTRAST CT CERVICAL SPINE WITHOUT CONTRAST TECHNIQUE: Multidetector CT imaging of the head and cervical spine was performed following the standard protocol without intravenous contrast. Multiplanar CT image reconstructions of the cervical spine were also generated. COMPARISON:  Head CT 06/24/2015 FINDINGS: CT HEAD FINDINGS Brain: Ventricles, cisterns and other CSF spaces are within normal as there is mild age related atrophic change. Moderate chronic ischemic microvascular disease. Old right posterior MCA/watershed infarct. No mass, mass effect, shift of midline structures or acute hemorrhage. No evidence of acute infarction. Vascular: No hyperdense vessel or unexpected calcification. Skull: No acute fracture. Sinuses/Orbits: No acute finding. Other: None. CT CERVICAL SPINE FINDINGS Alignment: Normal. Skull base and  vertebrae: Mild to moderate spondylosis throughout the cervical spine. Vertebral body heights are maintained. Uncovertebral joint spurring and facet arthropathy is present. Atlantoaxial articulation is unremarkable. Significant bilateral neural foraminal narrowing at multiple levels due to adjacent bony spurring. No acute fracture or subluxation. Soft tissues and spinal canal: Prevertebral soft tissues are normal. Spinal canal is within normal. Disc levels:  Mild disc space narrowing at the C5-6 level. Upper chest: Negative. Other: None. IMPRESSION: 1.  No acute brain injury. 2. Moderate chronic ischemic microvascular disease. Old right-sided infarct unchanged. 3.  No acute cervical spine injury. 4. Mild to moderate spondylosis of the cervical spine with mild disc disease at the C5-6 level. Moderate bilateral neural foraminal narrowing at multiple levels due to adjacent bony spurring. Electronically Signed   By: Marin Olp M.D.   On: 11/25/2018 07:24   Ct Cervical Spine Wo Contrast  Result Date: 11/25/2018 CLINICAL DATA:  Fall with headache and neck pain. EXAM: CT HEAD WITHOUT CONTRAST CT CERVICAL SPINE WITHOUT CONTRAST TECHNIQUE: Multidetector CT imaging of the head and cervical spine was performed following the standard protocol without intravenous contrast. Multiplanar CT image reconstructions of the cervical spine were also generated. COMPARISON:  Head CT 06/24/2015 FINDINGS: CT HEAD FINDINGS Brain: Ventricles, cisterns and other CSF spaces are within normal as there is mild age related atrophic change. Moderate chronic ischemic microvascular disease. Old right posterior MCA/watershed infarct. No mass, mass effect, shift of midline structures or acute hemorrhage. No evidence of acute infarction. Vascular: No hyperdense vessel or unexpected calcification. Skull: No acute fracture. Sinuses/Orbits: No acute finding. Other: None. CT CERVICAL SPINE FINDINGS Alignment: Normal. Skull base and vertebrae: Mild to  moderate spondylosis throughout the cervical spine. Vertebral body heights are maintained. Uncovertebral joint spurring and facet arthropathy is present. Atlantoaxial articulation is unremarkable. Significant bilateral neural foraminal narrowing at multiple levels due to adjacent bony spurring. No acute fracture or subluxation. Soft tissues and spinal canal: Prevertebral soft tissues are normal. Spinal canal is within normal. Disc levels:  Mild disc space narrowing at the C5-6 level. Upper chest: Negative. Other: None. IMPRESSION: 1.  No acute brain injury. 2. Moderate chronic ischemic microvascular disease.  Old right-sided infarct unchanged. 3.  No acute cervical spine injury. 4. Mild to moderate spondylosis of the cervical spine with mild disc disease at the C5-6 level. Moderate bilateral neural foraminal narrowing at multiple levels due to adjacent bony spurring. Electronically Signed   By: Marin Olp M.D.   On: 11/25/2018 07:24   Dg Pelvis Comp Min 3v  Result Date: 11/25/2018 CLINICAL DATA:  Fall with right shoulder injury. EXAM: JUDET PELVIS - 3+ VIEW COMPARISON:  None. FINDINGS: No evidence of pelvic ring fracture. No diastasis or hip dislocation. Degenerative spurring of the hips and visible lower lumbar endplates. Osteopenia and atherosclerotic calcification. IMPRESSION: No acute finding. Electronically Signed   By: Monte Fantasia M.D.   On: 11/25/2018 07:09   Dg Shoulder Right Portable  Result Date: 11/25/2018 CLINICAL DATA:  Status post manipulation of right shoulder EXAM: PORTABLE RIGHT SHOULDER COMPARISON:  Earlier today FINDINGS: Relocated glenohumeral joint.  No visible fracture. Acromioclavicular osteoarthritis with superior spurring. Osteopenia. IMPRESSION: Relocated glenohumeral joint. Electronically Signed   By: Monte Fantasia M.D.   On: 11/25/2018 09:59   Dg Knee Complete 4 Views Right  Result Date: 11/25/2018 CLINICAL DATA:  85 year old female status post fall at home. Pain. EXAM:  RIGHT KNEE - COMPLETE 4+ VIEW COMPARISON:  None. FINDINGS: No joint effusion identified on the cross-table lateral view. Calcified peripheral vascular disease. Mild for age right knee degenerative changes including mild medial compartment joint space loss. Patella appears intact. No acute osseous abnormality identified. No discrete soft tissue IMPRESSION: 1. No acute fracture or dislocation identified about the right knee. 2. Calcified peripheral vascular disease. Electronically Signed   By: Genevie Ann M.D.   On: 11/25/2018 07:00   Dg Hips Bilat With Pelvis 3-4 Views  Result Date: 11/25/2018 CLINICAL DATA:  Hip pain EXAM: DG HIP (WITH OR WITHOUT PELVIS) 3-4V BILAT COMPARISON:  Pelvis radiograph earlier today FINDINGS: Negative for hip fracture or dislocation. Symmetric degenerative spurring at the hips. Osteopenia and atherosclerosis. IMPRESSION: No acute finding. Electronically Signed   By: Monte Fantasia M.D.   On: 11/25/2018 08:48   Ct Maxillofacial Wo Contrast  Result Date: 11/25/2018 CLINICAL DATA:  Facial trauma, fall EXAM: CT MAXILLOFACIAL WITHOUT CONTRAST TECHNIQUE: Multidetector CT imaging of the maxillofacial structures was performed. Multiplanar CT image reconstructions were also generated. COMPARISON:  None. FINDINGS: Osseous: No fracture or mandibular dislocation. No destructive process. Orbits: Negative. No traumatic or inflammatory finding. Sinuses: Clear Soft tissues: Negative Limited intracranial: No significant or unexpected finding. IMPRESSION: No evidence of facial or orbital fracture. Electronically Signed   By: Rolm Baptise M.D.   On: 11/25/2018 09:07    Procedures Procedures (including critical care time)  Medications Ordered in ED Medications  fentaNYL (SUBLIMAZE) injection 50 mcg (50 mcg Intravenous Given 11/25/18 0907)  sodium chloride 0.9 % bolus 500 mL (0 mLs Intravenous Stopped 11/25/18 1255)     Initial Impression / Assessment and Plan / ED Course  I have reviewed the  triage vital signs and the nursing notes.  Pertinent labs & imaging results that were available during my care of the patient were reviewed by me and considered in my medical decision making (see chart for details).  Clinical Course as of Nov 25 1519  Mon Nov 25, 2018  0716 No acute fracture or dislocation identified about the right knee.     DG Knee Complete 4 Views Right [AH]  0719 Anterior glenohumeral dislocation noted on right shoulder.  DG Shoulder Right [AH]  0730 No  acute brain injury. Moderate chronic ischemic microvascular disease. Old right-sided infarct unchanged. CT neck reveals no acute cervical spine injury. Mild to moderate spondylosis of the cervical spine with mild disc disease at the C5-6 level. Moderate bilateral neural foraminal narrowing at multiple levels due to adjacent bony spurring.    CT Head Wo Contrast [AH]  0923 No evidence of facial or orbital fracture noted on Maxillofacial CT.   CT Maxillofacial Wo Contrast [AH]  8338 Relocated glenohumeral joint noted on post reduction x ray.  DG Shoulder Right Portable [AH]  1029 Elevated creatinine at 1.76. Will provide IVF.  Creatinine(!): 1.76 [AH]  1029 UA is unremarkable.  Urinalysis, Routine w reflex microscopic(!) [AH]    Clinical Course User Index [AH] Arville Lime, PA-C      Patient presents after a fall. CT head is negative. X ray of right shoulder reveals anterior glenohumeral dislocation. Provided Fentanyl and assisted Dr. Regenia Skeeter with shoulder reduction. Post reduction film reveals relocated glenohumeral joint. Advised patient to follow up with orthopedics. Patient is not able to ambulate due to generalized weakness and unable to use arm for support. Discussed case with social work. Ordered home health for patient for assistance. Discussed elevated BP during today's visit. Patient denies any symptoms of hypertensive crisis. Advised patient to follow up with PCP. Patient is stable and will be  discharged home. Discussed plan with patient and sisters. Patient and sisters state they understand and agree with plan.   Findings and plan of care discussed with supervising physician Dr. Regenia Skeeter who personally evaluated and examined this patient.  Final Clinical Impressions(s) / ED Diagnoses   Final diagnoses:  Hip pain  Fall, initial encounter  Dislocation of right shoulder joint, initial encounter  Weakness    ED Discharge Orders    None       Arville Lime, [place] 11/25/18 1522    Sherwood Gambler, MD 11/25/18 1537

## 2018-11-25 NOTE — ED Notes (Signed)
Pt was not able to ambulate due to right arm restriction.  RN informed

## 2018-11-25 NOTE — ED Triage Notes (Signed)
[place] via EMS from home where she lives with daughter. Mechanical fall, hit face-down, EMS reports possible humeral dislocation, Right shoulder. No bloodthinners.

## 2018-11-25 NOTE — ED Notes (Signed)
Bed: JP21 Expected date:  Expected time:  Means of arrival:  Comments: 85yo F/Fall

## 2018-11-25 NOTE — Care Management (Signed)
Patient's referral has been accepted by [facility] for [facility] services as per [place] liaison. Anticipated start of care 24- 48  Hours. CM attempted to update [facility] unable to connect with [facility] ED staff by phone. CM will make second attempt.

## 2018-11-25 NOTE — ED Provider Notes (Signed)
  Physical Exam    Reduction of dislocation Date/Time: 11/25/2018 10:57 AM Performed by: Sherwood Gambler, MD Authorized by: Sherwood Gambler, MD  Consent: Verbal consent obtained. Written consent obtained. Risks and benefits: risks, benefits and alternatives were discussed Consent given by: patient and power of attorney Local anesthesia used: no  Anesthesia: Local anesthesia used: no  Sedation: Patient sedated: no  Patient tolerance: Patient tolerated the procedure well with no immediate complications Comments: Shoulder was externally rotated and abducted and with traction was reduced.      MDM  Patient tolerated reduction well. Post reduction film shows good alignment.      Sherwood Gambler, MD 11/25/18 1122

## 2018-11-25 NOTE — ED Notes (Signed)
One unsuccessful attempt by this RN to start an IV.

## 2018-11-25 NOTE — Telephone Encounter (Signed)
"  I'm calling for [employer].  She's scheduled for an appointment on Wednesday at 2:45.  I would have to cancel, she's in the hospital right now.  I need to make a cancellation.  I'll call you back when she's able to come in to see Dr. March Rummage."  I attempted to return her call.  I left her a message that I will cancel the appointment for Thursday, March 5. I wished her mother well.  I asked her to give me a call whenever her mother was feeling better and ready to reschedule her surgery.

## 2018-11-25 NOTE — ED Notes (Addendum)
Family decided to transport pt home on their own. Family warned and educated about risks of this but they stated they were able to do it and didn't want to wait for transport home. Education given.

## 2018-11-25 NOTE — ED Notes (Signed)
Patient transported to X-ray 

## 2018-11-25 NOTE — Discharge Planning (Signed)
   Home health agencies that serve [phone]. Your favorite home health agencies  [place] of Patient Care Rating Patient Survey Summary Rating  [place]  [phone] 4 out of 5 stars 4 out of [place]  [phone] 3 out of 5 stars 4 out of [place]  [phone] 3 out of 5 stars 4 out of [place]  [phone] 4  out of 5 stars 4 out of [place]  [phone] 4  out of 5 stars 4 out of [place]  [phone] 4 out of 5 stars 4 out of 5 stars  [facility]  [phone] 3  out of 5 stars 3 out of [place]  [phone] 3 out of 5 stars 3 out of 5 stars  [facility]  [phone] 4 out of 5 stars Not Available11  [facility]  [phone] 3 out of 5 stars 2 out of [place]  [phone] 3  out of 5 stars 4 out of [place]  [phone]

## 2018-11-25 NOTE — Progress Notes (Addendum)
Update 3:19PM: CSW spoke with patient and patients three daughters via bedside- patient and daughter would prefer patient to go home with [place] at this time. Patient has a walker and cane at home that she can use for stabilization. Patient also received services with [facility] on Thursdays and Fridays. CSW notified PA and RN CM.   Patient will need transportation home via [place] attempted to contact patients daughter to determine if they would like [place] and if so, which company. Voicemail left for return call.   Kingsley Spittle, LCSW Clinical Social Worker  [facility]  [phone]

## 2018-11-25 NOTE — Discharge Instructions (Addendum)
You have been seen today after a fall. Please read and follow all provided instructions.   1. Medications: tylenol for pain, use brace, usual home medications 2. Treatment: rest, drink plenty of fluids 3. Follow Up: Please follow up with your primary doctor in 2 days for discussion of your diagnoses and further evaluation after today's visit; if you do not have a primary care doctor use the resource guide provided to find one; Please return to the ER for any new or worsening symptoms. Please obtain all of your results from medical records or have your doctors office obtain the results - share them with your doctor - you should be seen at your doctors office. Call today to arrange your follow up.   Take medications as prescribed. Please review all of the medicines and only take them if you do not have an allergy to them. Return to the emergency room for worsening condition or new concerning symptoms. Follow up with your regular doctor. If you don't have a regular doctor use one of the numbers below to establish a primary care doctor.  Please be aware that if you are taking birth control pills, taking other prescriptions, ESPECIALLY ANTIBIOTICS may make the birth control ineffective - if this is the case, either do not engage in sexual activity or use alternative methods of birth control such as condoms until you have finished the medicine and your family doctor says it is OK to restart them. If you are on a blood thinner such as COUMADIN, be aware that any other medicine that you take may cause the coumadin to either work too much, or not enough - you should have your coumadin level rechecked in next 7 days if this is the case.  ?  It is also a possibility that you have an allergic reaction to any of the medicines that you have been prescribed - Everybody reacts differently to medications and while MOST people have no trouble with most medicines, you may have a reaction such as nausea, vomiting, rash,  swelling, shortness of breath. If this is the case, please stop taking the medicine immediately and contact your physician.  ?  You should return to the ER if you develop severe or worsening symptoms.   Emergency Department Resource Guide 1) Find a Doctor and Pay Out of Pocket Although you won't have to find out who is covered by your insurance plan, it is a good idea to ask around and get recommendations. You will then need to call the office and see if the doctor you have chosen will accept you as a new patient and what types of options they offer for patients who are self-pay. Some doctors offer discounts or will set up payment plans for their patients who do not have insurance, but you will need to ask so you aren't surprised when you get to your appointment.  2) Contact Your Local Health Department Not all health departments have doctors that can see patients for sick visits, but many do, so it is worth a call to see if yours does. If you don't know where your local health department is, you can check in your phone book. The CDC also has a tool to help you locate your state's health department, and many state websites also have listings of all of their local health departments.  3) Find a [facility] If your illness is not likely to be very severe or complicated, you may want to try a walk in clinic. These  are popping up all over the country in pharmacies, drugstores, and shopping centers. They're usually staffed by nurse practitioners or physician assistants that have been trained to treat common illnesses and complaints. They're usually fairly quick and inexpensive. However, if you have serious medical issues or chronic medical problems, these are probably not your best option.  No Primary Care Doctor: Call Health Connect at  [phone] - they can help you locate a primary care doctor that  accepts your insurance, provides certain services, etc. Physician Referral [phone]  Emergency Department Resource Guide 1) Find a Doctor and Pay Out of Pocket Although you won't have to find out who is covered by your insurance plan, it is a good idea to ask around and get recommendations. You will then need to call the office and see if the doctor you have chosen will accept you as a new patient and what types of options they offer for patients who are self-pay. Some doctors offer discounts or will set up payment plans for their patients who do not have insurance, but you will need to ask so you aren't surprised when you get to your appointment.  2) Contact Your Local Health Department Not all health departments have doctors that can see patients for sick visits, but many do, so it is worth a call to see if yours does. If you don't know where your local health department is, you can check in your phone book. The CDC also has a tool to help you locate your state's health department, and many state websites also have listings of all of their local health departments.  3) Find a [facility] If your illness is not likely to be very severe or complicated, you may want to try a walk in clinic. These are popping up all over the country in pharmacies, drugstores, and shopping centers. They're usually staffed by nurse practitioners or physician assistants that have been trained to treat common illnesses and complaints. They're usually fairly quick and inexpensive. However, if you have serious medical issues or chronic medical problems, these are probably not your best option.  No Primary Care Doctor: Call Health Connect at  [phone] - they can help you locate a primary care doctor that  accepts your insurance, provides certain services, etc. Physician Referral Service- [phone]  Chronic Pain Problems: Organization         Address  Phone   Notes  [facility]  [phone] Patients need to be referred by their primary care doctor.    Medication Assistance: Organization         Address  Phone   Notes  [facility] Medication [facility] [address] [place], [place] [address] [phone] --Must be a resident of [facility] -- Must have NO insurance coverage whatsoever (no Medicaid/ Medicare, etc.) -- The pt. MUST have a primary care doctor that directs their care regularly and follows them in the community   [facility]  [phone]   [employer]  [phone]    Agencies that provide inexpensive medical care: Organization         Address  Phone   Notes  [place]  [phone]   [facility]    [phone]   [facility] [place] [place],  [phone]   [place] [address] [place] [phone]   [facility]anned [facility]    (  [phone]   [facility]    [phone]   [facility]  [address], [place] Phone:  [phone], Fax:  [phone] Hours of Operation:  9 am - 6 pm, M-F.  Also accepts Medicaid/Medicare and self-pay.  [facility] for [place] [address], [address], [place] Phone: [phone], Fax: [phone]. Hours of Operation:  8:30 am - 5:30 pm, M-F.  Also accepts Medicaid and self-pay.  [facility] [place] [address], [place] Phone: [phone]   [place], [place], [place] [phone], Ext. [phone] Mondays & Thursdays: 7-9 AM.  First 15 patients are seen on a first come, first serve basis.    [place] Providers:  Organization         Address  Phone   Notes  [facility] [address], [place] [phone] Also accepts self-pay patients.  [facility] [address], [place]  [phone]   [place], [address], [place] [phone]   [facility] [address] [place] [phone]   Lucianne Lei [address], [place]   [phone] Only accepts [place] [facility] [place] patients after they have their name applied to their card.   Self-Pay (no insurance) in [facility]:  Organization         Address  Phone   Notes  Sickle Cell Patients, [facility] [place] [phone]   [facility] [place] [phone]   [facility] [place]  [place], [place], [place] [phone]   [facility]/Dr. Osei-Bonsu  [address], [place] or [address], [place] [phone] Phone number for both [place] and [place] locations is the same.  [facility] [address], [place] [phone]   [facility] [address] [place] or [address] [phone]   [facility] [address] [place] [phone], phone; [phone], fax Sees patients 1st and 3rd Saturday of every month.  Must not qualify for public or private insurance (i.e. Medicaid, Medicare, [place] Health Choice, Veterans' Benefits)  Household income should be no more than 200% of the poverty level The clinic cannot treat you if you are pregnant or think you are pregnant  Sexually transmitted diseases are not treated at the clinic.

## 2018-11-25 NOTE — ED Notes (Signed)
Bed: WA11 Expected date:  Expected time:  Means of arrival:  Comments: 

## 2018-11-27 DIAGNOSIS — W19XXXD Unspecified fall, subsequent encounter: Secondary | ICD-10-CM | POA: Diagnosis not present

## 2018-11-27 DIAGNOSIS — M19011 Primary osteoarthritis, right shoulder: Secondary | ICD-10-CM | POA: Diagnosis not present

## 2018-11-27 DIAGNOSIS — M8580 Other specified disorders of bone density and structure, unspecified site: Secondary | ICD-10-CM | POA: Diagnosis not present

## 2018-11-27 DIAGNOSIS — M2578 Osteophyte, vertebrae: Secondary | ICD-10-CM | POA: Diagnosis not present

## 2018-11-27 DIAGNOSIS — S43004D Unspecified dislocation of right shoulder joint, subsequent encounter: Secondary | ICD-10-CM | POA: Diagnosis not present

## 2018-11-27 DIAGNOSIS — I739 Peripheral vascular disease, unspecified: Secondary | ICD-10-CM | POA: Diagnosis not present

## 2018-11-27 DIAGNOSIS — I10 Essential (primary) hypertension: Secondary | ICD-10-CM | POA: Diagnosis not present

## 2018-11-27 DIAGNOSIS — S0081XD Abrasion of other part of head, subsequent encounter: Secondary | ICD-10-CM | POA: Diagnosis not present

## 2018-11-27 DIAGNOSIS — S00532D Contusion of oral cavity, subsequent encounter: Secondary | ICD-10-CM | POA: Diagnosis not present

## 2018-11-27 DIAGNOSIS — M25759 Osteophyte, unspecified hip: Secondary | ICD-10-CM | POA: Diagnosis not present

## 2018-11-28 ENCOUNTER — Ambulatory Visit: Payer: Medicare HMO | Admitting: Podiatry

## 2018-12-03 DIAGNOSIS — S43004A Unspecified dislocation of right shoulder joint, initial encounter: Secondary | ICD-10-CM | POA: Diagnosis not present

## 2018-12-03 DIAGNOSIS — M25511 Pain in right shoulder: Secondary | ICD-10-CM | POA: Diagnosis not present

## 2018-12-09 ENCOUNTER — Telehealth: Payer: Self-pay | Admitting: *Deleted

## 2018-12-09 NOTE — Telephone Encounter (Signed)
"  I'm calling to reschedule my mother's surgery that I had canceled previously.  She said that corn is bothering her really bad again."  Dr. March Rummage can do it on April 1.  "That's the soonest you have?  That toe is really giving her a fit."  Dr. March Rummage can do it on March 26, arrive at 11 am.  "Thank you so much."

## 2018-12-19 ENCOUNTER — Other Ambulatory Visit: Payer: Self-pay

## 2018-12-19 ENCOUNTER — Ambulatory Visit (INDEPENDENT_AMBULATORY_CARE_PROVIDER_SITE_OTHER): Payer: Medicare HMO | Admitting: Podiatry

## 2018-12-19 DIAGNOSIS — M2042 Other hammer toe(s) (acquired), left foot: Secondary | ICD-10-CM

## 2018-12-19 NOTE — Progress Notes (Signed)
  Subjective:  Patient ID: Dorothy Noble, female    DOB: [birth year],  MRN: [id number]  Chief Complaint  Patient presents with  . Foot Problem    OFFICE SURGERY: FLEXOR TENOTOMY 2ND LT   85 y.o. female returns today for planned flexor tenotomy of the L 2nd and 3rd digits.  Objective:  There were no vitals filed for this visit.  General AA&O x3. Normal mood and affect.  Vascular Pedal pulses palpable.  Neurologic Epicritic sensation grossly intact.  Dermatologic Pre-ulcerative callus at the tip of the left, 2nd toe  Orthopedic: Semi-reducible hammertoe deformity left, 2nd toe, 3rd toe    Assessment & Plan:  Patient was evaluated and treated and all questions answered.  Hammertoe L 2nd toe with pre-ulcerative callus, Hammertoe L 3rd toe -Flexor tenotomy as below. -Advised to remove the dressing in 24 hours and apply a band-aid and triple abx ointment every day thereafter. -Dispense surgical shoe for safe offloading. -Pts daughter present and provided written consent for procedure   Procedure: Flexor Tenotomy Indication for Procedure: toe with semi-reducible hammertoe with distal tip ulceration. Flexor tenotomy indicated to alleviate contracture, reduce pressure, and enhance healing of the ulceration. Location: left, 2nd toe, 3rd toe Anesthesia: Lidocaine 1% plain; 1.5 mL and Marcaine 0.5% plain; 1.5 mL digital block Instrumentation: 18 g needle  Technique: The toe was anesthetized as above and prepped in the usual fashion. An 18g needle was then used to percutaneously release both flexor tendons at the plantar surface of the toe with noted release of the hammertoe deformity. The incision was then dressed with antibiotic ointment and band-aid. Patient tolerated the procedure well. Dressing: Dry, sterile, dressing. Disposition: Patient tolerated procedure well. Patient to return in 1 week for follow-up.    Return in about 2 weeks (around 01/02/2019) for flexor tenotomy f/u L 2nd/3rd toes.

## 2018-12-27 ENCOUNTER — Encounter: Payer: Self-pay | Admitting: Podiatry

## 2018-12-27 ENCOUNTER — Other Ambulatory Visit: Payer: Self-pay

## 2018-12-27 ENCOUNTER — Ambulatory Visit (INDEPENDENT_AMBULATORY_CARE_PROVIDER_SITE_OTHER): Payer: Medicare HMO | Admitting: Podiatry

## 2018-12-27 ENCOUNTER — Encounter: Payer: Medicare HMO | Admitting: Podiatry

## 2018-12-27 DIAGNOSIS — M2042 Other hammer toe(s) (acquired), left foot: Secondary | ICD-10-CM

## 2018-12-27 NOTE — Progress Notes (Signed)
  Subjective:  Patient ID: Dorothy Noble, female    DOB: [birth year],  MRN: [id number]  Chief Complaint  Patient presents with  . Routine Post Op    Follow up office procedure - flexor tenotomy 2nd and 3rd toes left   "Its feeling good, some soreness"    DOS: 12/19/2018 Procedure: Left 2nd/3rd toe flexor tenotomy  85 y.o. female returns for post-op check. Hx as above.  Review of Systems: Negative except as noted in the HPI. Denies N/V/F/Ch.  Past Medical History:  Diagnosis Date  . Arthritis   . Carotid artery occlusion   . Herniated disc   . Hypertension   . Stroke [facility])     Current Outpatient Medications:  .  aspirin EC 81 MG tablet, Take 81 mg by mouth daily.  , Disp: , Rfl:  .  feeding supplement, ENSURE ENLIVE, (ENSURE ENLIVE) LIQD, Take 237 mLs by mouth 3 (three) times daily between meals., Disp: 237 mL, Rfl: 12 .  hydrALAZINE (APRESOLINE) 25 MG tablet, Take 1 tablet (25 mg total) by mouth 3 (three) times daily., Disp: 90 tablet, Rfl: 0 .  megestrol (MEGACE) 400 MG/10ML suspension, Take 10 mLs (400 mg total) by mouth daily., Disp: 240 mL, Rfl: 0 .  Multiple Vitamins-Minerals (ICAPS AREDS 2 PO), Take 1 tablet by mouth 2 (two) times daily., Disp: , Rfl:  .  Omega-3 Fatty Acids (FISH OIL) 1000 MG CAPS, Take by mouth 3 (three) times daily., Disp: , Rfl:  .  Red Yeast Rice Extract (RED YEAST RICE PO), Take 2 tablets by mouth at bedtime., Disp: , Rfl:   Social History   Tobacco Use  Smoking Status Never Smoker  Smokeless Tobacco Never Used    No Known Allergies Objective:  There were no vitals filed for this visit. There is no height or weight on file to calculate BMI. Constitutional Well developed. Well nourished.  Vascular Foot warm and well perfused. Capillary refill normal to all digits.   Neurologic Normal speech. Oriented to person, place, and time. Epicritic sensation to light touch grossly present bilaterally.  Dermatologic Skin healed  Orthopedic: No  tenderness to palpation noted about the surgical site. 2nd toe rectus. Slight contracture still at 3rd toe.   Radiographs: None Assessment:   1. Hammertoe of left foot    Plan:  Patient was evaluated and treated and all questions answered.  S/p foot surgery left -Progressing as expected post-operatively. -XR: None -WB Status: WBAT in normal shoe -Sutures: none. -Medications: none refilled. -Foot redressed.  Return if symptoms worsen or fail to improve.

## 2019-01-02 ENCOUNTER — Ambulatory Visit: Payer: Medicare HMO | Admitting: Podiatry

## 2019-01-02 ENCOUNTER — Encounter: Payer: Medicare HMO | Admitting: Podiatry

## 2019-03-16 ENCOUNTER — Other Ambulatory Visit: Payer: Self-pay

## 2019-03-16 ENCOUNTER — Encounter (HOSPITAL_COMMUNITY): Payer: Self-pay | Admitting: Emergency Medicine

## 2019-03-16 ENCOUNTER — Observation Stay (HOSPITAL_COMMUNITY)
Admission: EM | Admit: 2019-03-16 | Discharge: 2019-03-18 | Disposition: A | Discharge status: Home / Self Care | Payer: Medicare HMO | Attending: Internal Medicine | Admitting: Internal Medicine

## 2019-03-16 DIAGNOSIS — N183 Chronic kidney disease, stage 3 (moderate): Secondary | ICD-10-CM

## 2019-03-16 DIAGNOSIS — I700 Atherosclerosis of aorta: Secondary | ICD-10-CM | POA: Diagnosis not present

## 2019-03-16 DIAGNOSIS — M47816 Spondylosis without myelopathy or radiculopathy, lumbar region: Secondary | ICD-10-CM | POA: Insufficient documentation

## 2019-03-16 DIAGNOSIS — M8580 Other specified disorders of bone density and structure, unspecified site: Secondary | ICD-10-CM | POA: Diagnosis not present

## 2019-03-16 DIAGNOSIS — N200 Calculus of kidney: Secondary | ICD-10-CM | POA: Insufficient documentation

## 2019-03-16 DIAGNOSIS — I722 Aneurysm of renal artery: Secondary | ICD-10-CM | POA: Diagnosis not present

## 2019-03-16 DIAGNOSIS — Z7982 Long term (current) use of aspirin: Secondary | ICD-10-CM | POA: Insufficient documentation

## 2019-03-16 DIAGNOSIS — Z6823 Body mass index (BMI) 23.0-23.9, adult: Secondary | ICD-10-CM | POA: Diagnosis not present

## 2019-03-16 DIAGNOSIS — Z8673 Personal history of transient ischemic attack (TIA), and cerebral infarction without residual deficits: Secondary | ICD-10-CM | POA: Diagnosis not present

## 2019-03-16 DIAGNOSIS — E440 Moderate protein-calorie malnutrition: Secondary | ICD-10-CM

## 2019-03-16 DIAGNOSIS — Z1159 Encounter for screening for other viral diseases: Secondary | ICD-10-CM | POA: Insufficient documentation

## 2019-03-16 DIAGNOSIS — I129 Hypertensive chronic kidney disease with stage 1 through stage 4 chronic kidney disease, or unspecified chronic kidney disease: Secondary | ICD-10-CM | POA: Insufficient documentation

## 2019-03-16 DIAGNOSIS — K922 Gastrointestinal hemorrhage, unspecified: Secondary | ICD-10-CM | POA: Diagnosis not present

## 2019-03-16 DIAGNOSIS — E669 Obesity, unspecified: Secondary | ICD-10-CM

## 2019-03-16 DIAGNOSIS — Z79899 Other long term (current) drug therapy: Secondary | ICD-10-CM | POA: Insufficient documentation

## 2019-03-16 DIAGNOSIS — K5731 Diverticulosis of large intestine without perforation or abscess with bleeding: Secondary | ICD-10-CM | POA: Diagnosis not present

## 2019-03-16 DIAGNOSIS — M1990 Unspecified osteoarthritis, unspecified site: Secondary | ICD-10-CM | POA: Insufficient documentation

## 2019-03-16 DIAGNOSIS — D62 Acute posthemorrhagic anemia: Principal | ICD-10-CM | POA: Insufficient documentation

## 2019-03-16 DIAGNOSIS — M4316 Spondylolisthesis, lumbar region: Secondary | ICD-10-CM | POA: Insufficient documentation

## 2019-03-16 DIAGNOSIS — Z03818 Encounter for observation for suspected exposure to other biological agents ruled out: Secondary | ICD-10-CM | POA: Diagnosis not present

## 2019-03-16 DIAGNOSIS — N1832 Chronic kidney disease, stage 3b: Secondary | ICD-10-CM

## 2019-03-16 DIAGNOSIS — I10 Essential (primary) hypertension: Secondary | ICD-10-CM | POA: Diagnosis not present

## 2019-03-16 LAB — CBC WITH DIFFERENTIAL/PLATELET
Abs Immature Granulocytes: 0.01 10*3/uL (ref 0.00–0.07)
Basophils Absolute: 0 10*3/uL (ref 0.0–0.1)
Basophils Relative: 0 %
Eosinophils Absolute: 0 10*3/uL (ref 0.0–0.5)
Eosinophils Relative: 1 %
HCT: 36.4 % (ref 36.0–46.0)
Hemoglobin: 11.7 g/dL — ABNORMAL LOW (ref 12.0–15.0)
Immature Granulocytes: 0 %
Lymphocytes Relative: 42 %
Lymphs Abs: 2.6 10*3/uL (ref 0.7–4.0)
MCH: 31.6 pg (ref 26.0–34.0)
MCHC: 32.1 g/dL (ref 30.0–36.0)
MCV: 98.4 fL (ref 80.0–100.0)
Monocytes Absolute: 0.6 10*3/uL (ref 0.1–1.0)
Monocytes Relative: 9 %
Neutro Abs: 3 10*3/uL (ref 1.7–7.7)
Neutrophils Relative %: 48 %
Platelets: 169 10*3/uL (ref 150–400)
RBC: 3.7 MIL/uL — ABNORMAL LOW (ref 3.87–5.11)
RDW: 14.1 % (ref 11.5–15.5)
WBC: 6.2 10*3/uL (ref 4.0–10.5)
nRBC: 0 % (ref 0.0–0.2)

## 2019-03-16 LAB — COMPREHENSIVE METABOLIC PANEL
ALT: 14 U/L (ref 0–44)
AST: 18 U/L (ref 15–41)
Albumin: 3.6 g/dL (ref 3.5–5.0)
Alkaline Phosphatase: 62 U/L (ref 38–126)
Anion gap: 11 (ref 5–15)
BUN: 41 mg/dL — ABNORMAL HIGH (ref 8–23)
CO2: 21 mmol/L — ABNORMAL LOW (ref 22–32)
Calcium: 9.8 mg/dL (ref 8.9–10.3)
Chloride: 109 mmol/L (ref 98–111)
Creatinine, Ser: 1.6 mg/dL — ABNORMAL HIGH (ref 0.44–1.00)
GFR calc Af Amer: 31 mL/min — ABNORMAL LOW (ref >60)
GFR calc non Af Amer: 27 mL/min — ABNORMAL LOW (ref >60)
Glucose, Bld: 92 mg/dL (ref 70–99)
Potassium: 4.8 mmol/L (ref 3.5–5.1)
Sodium: 141 mmol/L (ref 135–145)
Total Bilirubin: 0.5 mg/dL (ref 0.3–1.2)
Total Protein: 7.3 g/dL (ref 6.5–8.1)

## 2019-03-16 LAB — TYPE AND SCREEN
ABO/RH(D): A POS
Antibody Screen: NEGATIVE

## 2019-03-16 LAB — SARS CORONAVIRUS 2 BY RT PCR (HOSPITAL ORDER, PERFORMED IN ~~LOC~~ HOSPITAL LAB): SARS Coronavirus 2: NEGATIVE

## 2019-03-16 LAB — POC OCCULT BLOOD, ED: Fecal Occult Bld: POSITIVE — AB

## 2019-03-16 MED ORDER — ACETAMINOPHEN 650 MG RE SUPP: 650.0000 mg | Freq: Four times a day (QID) | RECTAL | Status: DC | PRN

## 2019-03-16 MED ORDER — ACETAMINOPHEN 325 MG PO TABS
650.0000 mg | ORAL_TABLET | Freq: Four times a day (QID) | ORAL | Status: DC | PRN
  Administered 2019-03-17 – 2019-03-18 (×2): 650 mg via ORAL
  Filled 2019-03-16 (×2): qty 2

## 2019-03-16 MED ORDER — HYDRALAZINE HCL 20 MG/ML IJ SOLN
10.0000 mg | INTRAMUSCULAR | Status: DC | PRN
  Administered 2019-03-16 – 2019-03-17 (×2): 10 mg via INTRAVENOUS
  Filled 2019-03-16: qty 1

## 2019-03-16 NOTE — ED Triage Notes (Signed)
Pt here for rectal bleeding that started last night. Pt states her stool is black and tarry with bright red blood. Pt states she also feels dizzy.

## 2019-03-16 NOTE — ED Provider Notes (Signed)
[place] EMERGENCY DEPARTMENT Provider Note   CSN: [id number] Arrival date & time: 03/16/19  1454    History   Chief Complaint No chief complaint on file.   HPI Dorothy Noble is a 85 y.o. female.     HPI   85yo female with history of CVA, htn, carotid artery occlusion who presents with concern for rectal bleeding.  Reports an episode last night and episode today. Episode today was burgundy or marroon colored stool.  No hx of prior GI bleed. Does not have GI physician.  Reports some lightheadedness. No n/v/d/abdominal pain or fevers. Not on anticoagulation   Past Medical History:  Diagnosis Date  . Arthritis   . Carotid artery occlusion   . Herniated disc   . Hypertension   . Stroke [facility])     Patient Active Problem List   Diagnosis Date Noted  . Acute blood loss anemia 03/16/2019  . Lower GI bleed 03/16/2019  . Stage 3b chronic kidney disease ([place]) 03/16/2019  . GI bleed 03/16/2019  . Malnutrition of moderate degree ([place]) 06/26/2015  . Weakness 06/24/2015  . Numbness- Right Hand 01/22/2014  . Chest pain 01/22/2014  . Occlusion and stenosis of carotid artery without mention of cerebral infarction 02/10/2013    Past Surgical History:  Procedure Laterality Date  . Blood clot removed from R ankle    . Carotid endarderectomy    . CAROTID ENDARTERECTOMY Right 05-14-09   cea     OB History   No obstetric history on file.      Home Medications    Prior to Admission medications   Medication Sig Start Date End Date Taking? Authorizing Provider  aspirin EC 81 MG tablet Take 81 mg by mouth daily.     Yes [provider]  BIOTIN PO Take 1 tablet by mouth daily with breakfast.   Yes [provider]  Dextran 70-Hypromellose (ARTIFICIAL TEARS PF OP) Place 1-2 drops into both eyes 3 (three) times daily as needed (for dryness).   Yes [provider]  hydrALAZINE (APRESOLINE) 25 MG tablet Take 1 tablet (25 mg total) by mouth 3  (three) times daily. Patient taking differently: Take 25 mg by mouth 2 (two) times a day.  06/26/15  Yes Elgergawy, Silver Huguenin, MD  Multiple Vitamins-Minerals (PRESERVISION AREDS 2) CAPS Take 1 capsule by mouth daily with breakfast.   Yes [provider]  naproxen sodium (ALEVE) 220 MG tablet Take 220 mg by mouth at bedtime as needed (for arthritic pain).   Yes [provider]  Omega-3 Fatty Acids (FISH OIL) 1000 MG CAPS Take 1,000 mg by mouth daily with breakfast.    Yes [provider]  Red Yeast Rice Extract (RED YEAST RICE PO) Take 2 tablets by mouth daily with breakfast.    Yes [provider]  feeding supplement, ENSURE ENLIVE, (ENSURE ENLIVE) LIQD Take 237 mLs by mouth 3 (three) times daily between meals. Patient not taking: Reported on 03/16/2019 06/26/15   Elgergawy, Silver Huguenin, MD  megestrol (MEGACE) 400 MG/10ML suspension Take 10 mLs (400 mg total) by mouth daily. Patient not taking: Reported on 03/16/2019 06/26/15   Elgergawy, Silver Huguenin, MD    Family History Family History  Problem Relation Age of Onset  . Hyperlipidemia Daughter   . Hypertension Daughter   . Other Daughter        varicose veins  . Varicose Veins Daughter   . Cancer Sister        Breast  .  Hyperlipidemia Sister   . Hypertension Sister   . Hypertension Daughter   . Hyperlipidemia Daughter     Social History Social History   Tobacco Use  . Smoking status: Never Smoker  . Smokeless tobacco: Never Used  Substance Use Topics  . Alcohol use: No  . Drug use: No     Allergies   Patient has no known allergies.   Review of Systems Review of Systems  Constitutional: Positive for fatigue. Negative for fever.  Eyes: Negative for visual disturbance.  Respiratory: Negative for cough and shortness of breath.   Cardiovascular: Negative for chest pain.  Gastrointestinal: Positive for blood in stool. Negative for abdominal pain, nausea and vomiting.  Genitourinary: Positive for  frequency (chronic). Negative for difficulty urinating.  Skin: Negative for rash.  Neurological: Positive for light-headedness. Negative for syncope and headaches.     Physical Exam Updated Vital Signs BP (S) (!) 207/69 (BP Location: Left Arm) Comment: PRN BP med administered   Pulse 65   Temp 98 F (36.7 C) (Oral)   Resp 18   Ht 5\' 2"  (1.575 m)   Wt 74.8 kg   SpO2 99%   BMI 30.18 kg/m   Physical Exam Vitals signs and nursing note reviewed.  Constitutional:      General: She is not in acute distress.    Appearance: She is well-developed. She is not diaphoretic.  HENT:     Head: Normocephalic and atraumatic.  Eyes:     Conjunctiva/sclera: Conjunctivae normal.  Neck:     Musculoskeletal: Normal range of motion.  Cardiovascular:     Rate and Rhythm: Normal rate and regular rhythm.  Pulmonary:     Effort: Pulmonary effort is normal. No respiratory distress.  Abdominal:     General: There is no distension.     Palpations: Abdomen is soft.     Tenderness: There is no abdominal tenderness. There is no guarding.  Genitourinary:    Uterus: No uterine prolapse.      Rectum: Guaiac result positive.     Comments: Marroon colored stool Musculoskeletal:        General: No tenderness.  Skin:    General: Skin is warm and dry.     Findings: No erythema or rash.  Neurological:     Mental Status: She is alert and oriented to person, place, and time.      ED Treatments / Results  Labs (all labs ordered are listed, but only abnormal results are displayed) Labs Reviewed  COMPREHENSIVE METABOLIC PANEL - Abnormal; Notable for the following components:      Result Value   CO2 21 (*)    BUN 41 (*)    Creatinine, Ser 1.60 (*)    GFR calc non Af Amer 27 (*)    GFR calc Af Amer 31 (*)    All other components within normal limits  CBC WITH DIFFERENTIAL/PLATELET - Abnormal; Notable for the following components:   RBC 3.70 (*)    Hemoglobin 11.7 (*)    All other components within  normal limits  POC OCCULT BLOOD, ED - Abnormal; Notable for the following components:   Fecal Occult Bld POSITIVE (*)    All other components within normal limits  SARS CORONAVIRUS 2 (HOSPITAL ORDER, [place] [facility])  COMPREHENSIVE METABOLIC PANEL  CBC  PROTIME-INR  TYPE AND SCREEN    EKG None  Radiology No results found.  Procedures Procedures (including critical care time)  Medications Ordered in ED Medications  acetaminophen (TYLENOL) tablet 650 mg (has no administration in time range)    Or  acetaminophen (TYLENOL) suppository 650 mg (has no administration in time range)  hydrALAZINE (APRESOLINE) injection 10 mg (10 mg Intravenous Given 03/16/19 2213)     Initial Impression / Assessment and Plan / ED Course  I have reviewed the triage vital signs and the nursing notes.  Pertinent labs & imaging results that were available during my care of the patient were reviewed by me and considered in my medical decision making (see chart for details).        85yo female with history of CVA, htn, carotid artery occlusion who presents with concern for rectal bleeding.  Hx and exam concerning for lower GI bleed.  Pt hemodynamically stable. Hgb 11 from previous of 14.  Consulted Dr. Cristina Gong, GI who will evaluate pt in AM and admitted to hospitalist for further care.  Final Clinical Impressions(s) / ED Diagnoses   Final diagnoses:  Lower GI bleed    ED Discharge Orders    None       Gareth Morgan, MD 03/17/19 [phone]

## 2019-03-16 NOTE — H&P (Signed)
History and Physical    Modest Draeger VHQ:[id number] DOB: [birth year] DOA: 03/16/2019  PCP: Kelton Pillar, MD  Patient coming from: Home  Chief Complaint: GI bleed  HPI: Dorothy Noble is a 85 y.o. female with medical history significant of HTN , prior CVA, arthritis who presents to the ED with melanotic stools that started one day prior to ED visit with another episode on the day of visit. Of note, patient is poor historian. Only reports loose stools recently and trying "not to soil the bed." Denies abd pain, fevers, sweats, chills. Admits to "coughing" but describes as clearing her throat.  ED Course: In the ED, pt noted to have hgb of 11.7 (was over 14 three months prior). Stools noted to be heme pos. GI consulted by EDP. Hospitalist consulted for consideration for medical admission  COVID testing remains pending at time of consultation  Review of Systems:  Review of Systems  Constitutional: Negative for chills and fever.  HENT: Negative for congestion, ear discharge, ear pain and sinus pain.   Eyes: Negative for double vision, photophobia and pain.  Respiratory: Negative for hemoptysis, sputum production and shortness of breath.   Cardiovascular: Negative for chest pain, palpitations and orthopnea.  Gastrointestinal: Positive for blood in stool. Negative for diarrhea, nausea and vomiting.  Genitourinary: Negative for frequency, hematuria and urgency.  Musculoskeletal: Negative for back pain and neck pain.  Neurological: Negative for tingling, tremors, sensory change, speech change, seizures and loss of consciousness.  Psychiatric/Behavioral: Negative for hallucinations, memory loss and substance abuse.    Past Medical History:  Diagnosis Date  . Arthritis   . Carotid artery occlusion   . Herniated disc   . Hypertension   . Stroke [facility])     Past Surgical History:  Procedure Laterality Date  . Blood clot removed from R ankle    . Carotid endarderectomy    . CAROTID  ENDARTERECTOMY Right 05-14-09   cea     reports that she has never smoked. She has never used smokeless tobacco. She reports that she does not drink alcohol or use drugs.  No Known Allergies  Family History  Problem Relation Age of Onset  . Hyperlipidemia Daughter   . Hypertension Daughter   . Other Daughter        varicose veins  . Varicose Veins Daughter   . Cancer Sister        Breast  . Hyperlipidemia Sister   . Hypertension Sister   . Hypertension Daughter   . Hyperlipidemia Daughter     Prior to Admission medications   Medication Sig Start Date End Date Taking? Authorizing Provider  aspirin EC 81 MG tablet Take 81 mg by mouth daily.      [provider]  feeding supplement, ENSURE ENLIVE, (ENSURE ENLIVE) LIQD Take 237 mLs by mouth 3 (three) times daily between meals. 06/26/15   Elgergawy, Silver Huguenin, MD  hydrALAZINE (APRESOLINE) 25 MG tablet Take 1 tablet (25 mg total) by mouth 3 (three) times daily. 06/26/15   Elgergawy, Silver Huguenin, MD  megestrol (MEGACE) 400 MG/10ML suspension Take 10 mLs (400 mg total) by mouth daily. 06/26/15   Elgergawy, Silver Huguenin, MD  Multiple Vitamins-Minerals (ICAPS AREDS 2 PO) Take 1 tablet by mouth 2 (two) times daily.    [provider]  Omega-3 Fatty Acids (FISH OIL) 1000 MG CAPS Take by mouth 3 (three) times daily.    [provider]  Red Yeast Rice Extract (RED YEAST RICE PO) Take 2 tablets by  mouth at bedtime.    [provider]    Physical Exam: Vitals:   03/16/19 1508 03/16/19 1509  BP: (!) 159/80   Pulse: 69   Resp: 16   Temp: 98.9 F (37.2 C)   TempSrc: Oral   SpO2: 99%   Weight:  74.8 kg  Height:  5\' 2"  (1.575 m)    Constitutional: NAD, calm, comfortable Vitals:   03/16/19 1508 03/16/19 1509  BP: (!) 159/80   Pulse: 69   Resp: 16   Temp: 98.9 F (37.2 C)   TempSrc: Oral   SpO2: 99%   Weight:  74.8 kg  Height:  5\' 2"  (1.575 m)   Eyes: PERRL, lids and conjunctivae normal ENMT: Mucous  membranes are moist. Posterior pharynx clear of any exudate or lesions.Normal dentition.  Neck: normal, supple, no masses, no thyromegaly Respiratory: no wheezing,Normal respiratory.  Cardiovascular: Regular rate and rhythm, 2+ pedal pulses. No carotid bruits.  Abdomen: no tenderness, no masses palpated. No hepatosplenomegaly. Bowel sounds positive.  Musculoskeletal: no clubbing / cyanosis. No joint deformity upper and lower extremities. Good ROM, no contractures. Normal muscle tone.  Skin: no rashes, lesions. No induration Neurologic: CN 2-12 grossly intact. Sensation intact, DTR normal. Strength 5/5 in all 4.  Psychiatric: Normal judgment and insight. Alert and oriented x 3. Normal mood.    Labs on Admission: I have personally reviewed following labs and imaging studies  CBC: Recent Labs  Lab 03/16/19 1531  WBC 6.2  NEUTROABS 3.0  HGB 11.7*  HCT 36.4  MCV 98.4  PLT 295   Basic Metabolic Panel: Recent Labs  Lab 03/16/19 1531  NA 141  K 4.8  CL 109  CO2 21*  GLUCOSE 92  BUN 41*  CREATININE 1.60*  CALCIUM 9.8   GFR: Estimated Creatinine Clearance: 19.9 mL/min (A) (by C-G formula based on SCr of 1.6 mg/dL (H)). Liver Function Tests: Recent Labs  Lab 03/16/19 1531  AST 18  ALT 14  ALKPHOS 62  BILITOT 0.5  PROT 7.3  ALBUMIN 3.6   No results for input(s): LIPASE, AMYLASE in the last 168 hours. No results for input(s): AMMONIA in the last 168 hours. Coagulation Profile: No results for input(s): INR, PROTIME in the last 168 hours. Cardiac Enzymes: No results for input(s): CKTOTAL, CKMB, CKMBINDEX, TROPONINI in the last 168 hours. BNP (last 3 results) No results for input(s): PROBNP in the last 8760 hours. HbA1C: No results for input(s): HGBA1C in the last 72 hours. CBG: No results for input(s): GLUCAP in the last 168 hours. Lipid Profile: No results for input(s): CHOL, HDL, LDLCALC, TRIG, CHOLHDL, LDLDIRECT in the last 72 hours. Thyroid Function Tests: No  results for input(s): TSH, T4TOTAL, FREET4, T3FREE, THYROIDAB in the last 72 hours. Anemia Panel: No results for input(s): VITAMINB12, FOLATE, FERRITIN, TIBC, IRON, RETICCTPCT in the last 72 hours. Urine analysis:    Component Value Date/Time   COLORURINE STRAW (A) 11/25/2018 0942   APPEARANCEUR CLEAR 11/25/2018 0942   LABSPEC 1.009 11/25/2018 0942   PHURINE 8.0 11/25/2018 0942   GLUCOSEU NEGATIVE 11/25/2018 0942   HGBUR NEGATIVE 11/25/2018 0942   BILIRUBINUR NEGATIVE 11/25/2018 0942   KETONESUR NEGATIVE 11/25/2018 0942   PROTEINUR NEGATIVE 11/25/2018 0942   UROBILINOGEN 0.2 06/24/2015 1215   NITRITE NEGATIVE 11/25/2018 0942   LEUKOCYTESUR NEGATIVE 11/25/2018 0942   Sepsis Labs: !!!!!!!!!!!!!!!!!!!!!!!!!!!!!!!!!!!!!!!!!!!! @LABRCNTIP (procalcitonin:4,lacticidven:4) )No results found for this or any previous visit (from the past 240 hour(s)).   Radiological Exams on Admission: No results found.  EKG: Independently reviewed. Sinus with QTc of 423  Assessment/Plan Principal Problem:   Acute blood loss anemia Active Problems:   Malnutrition of moderate degree (HCC)   Lower GI bleed   Stage 3b chronic kidney disease ([place])   1. Acute blood loss anemia from suspected lower GI bleed 1. Presenting hgb 11.7, was 14.2 three months prior 2. Stools grossly bloody, heme pos 3. Hemodynamically stable at this time. No indication for transfusion at this time 4. Will repeat CBC in AM 5. Hold ASA for now 6. GI consulted per EDP 7. Will continue on clear liquid diet in case endoscopy is warranted 2. Moderate protein calorie malnutrition 1. Will allow clears for now 2. Will consult Nutrition 3. Stage 3b CKD 1. Renal function reviewed, is near baseline 2. Avoid nephrotoxic agents 3. Repeat bmet in AM 4. HTN 1. BP presently suboptimally controlled 2. Will continue on hydralazine per home regimen 3. Will add PRN hydralazine 5. Hx CVA 1. Seems stable at this time 2. ASA held for now  given concern of acute blood loss anemia 3. Would resume ASA if/when GI bleeding resolves and pt cleared to resume ASA  DVT prophylaxis: SCD's  Code Status: Full, per family in room Family Communication: Pt in room, family not at bedside Disposition Plan: Uncertain at this time  Consults called: GI called by EDP Admission status: Observation as would likely require less than 2 midnight stay to stabilize GI bleed   Marylu Lund MD [facility] Pager On Amion  If 7PM-7AM, please contact night-coverage  03/16/2019, 5:03 PM

## 2019-03-17 ENCOUNTER — Observation Stay (HOSPITAL_COMMUNITY): Payer: Medicare HMO

## 2019-03-17 DIAGNOSIS — E669 Obesity, unspecified: Secondary | ICD-10-CM

## 2019-03-17 DIAGNOSIS — E440 Moderate protein-calorie malnutrition: Secondary | ICD-10-CM | POA: Diagnosis not present

## 2019-03-17 DIAGNOSIS — K922 Gastrointestinal hemorrhage, unspecified: Secondary | ICD-10-CM | POA: Diagnosis not present

## 2019-03-17 DIAGNOSIS — I700 Atherosclerosis of aorta: Secondary | ICD-10-CM | POA: Diagnosis not present

## 2019-03-17 DIAGNOSIS — N200 Calculus of kidney: Secondary | ICD-10-CM | POA: Diagnosis not present

## 2019-03-17 DIAGNOSIS — K5730 Diverticulosis of large intestine without perforation or abscess without bleeding: Secondary | ICD-10-CM | POA: Diagnosis not present

## 2019-03-17 DIAGNOSIS — D62 Acute posthemorrhagic anemia: Secondary | ICD-10-CM | POA: Diagnosis not present

## 2019-03-17 DIAGNOSIS — N183 Chronic kidney disease, stage 3 (moderate): Secondary | ICD-10-CM | POA: Diagnosis not present

## 2019-03-17 LAB — COMPREHENSIVE METABOLIC PANEL
ALT: 15 U/L (ref 0–44)
AST: 20 U/L (ref 15–41)
Albumin: 3.2 g/dL — ABNORMAL LOW (ref 3.5–5.0)
Alkaline Phosphatase: 58 U/L (ref 38–126)
Anion gap: 9 (ref 5–15)
BUN: 42 mg/dL — ABNORMAL HIGH (ref 8–23)
CO2: 23 mmol/L (ref 22–32)
Calcium: 9.6 mg/dL (ref 8.9–10.3)
Chloride: 110 mmol/L (ref 98–111)
Creatinine, Ser: 1.55 mg/dL — ABNORMAL HIGH (ref 0.44–1.00)
GFR calc Af Amer: 33 mL/min — ABNORMAL LOW (ref >60)
GFR calc non Af Amer: 28 mL/min — ABNORMAL LOW (ref >60)
Glucose, Bld: 108 mg/dL — ABNORMAL HIGH (ref 70–99)
Potassium: 4.6 mmol/L (ref 3.5–5.1)
Sodium: 142 mmol/L (ref 135–145)
Total Bilirubin: 0.6 mg/dL (ref 0.3–1.2)
Total Protein: 6.4 g/dL — ABNORMAL LOW (ref 6.5–8.1)

## 2019-03-17 LAB — CBC
HCT: 35.6 % — ABNORMAL LOW (ref 36.0–46.0)
Hemoglobin: 11.1 g/dL — ABNORMAL LOW (ref 12.0–15.0)
MCH: 31.4 pg (ref 26.0–34.0)
MCHC: 31.2 g/dL (ref 30.0–36.0)
MCV: 100.6 fL — ABNORMAL HIGH (ref 80.0–100.0)
Platelets: 161 10*3/uL (ref 150–400)
RBC: 3.54 MIL/uL — ABNORMAL LOW (ref 3.87–5.11)
RDW: 14.2 % (ref 11.5–15.5)
WBC: 8.7 10*3/uL (ref 4.0–10.5)
nRBC: 0 % (ref 0.0–0.2)

## 2019-03-17 LAB — LIPASE, BLOOD: Lipase: 32 U/L (ref 11–51)

## 2019-03-17 MED ORDER — BOOST / RESOURCE BREEZE PO LIQD CUSTOM
1.0000 | Freq: Three times a day (TID) | ORAL | Status: DC
  Administered 2019-03-17 (×2): 1 via ORAL
  Filled 2019-03-17 (×5): qty 1

## 2019-03-17 MED ORDER — HYDRALAZINE HCL 25 MG PO TABS
25.0000 mg | ORAL_TABLET | Freq: Three times a day (TID) | ORAL | Status: DC
  Administered 2019-03-17 – 2019-03-18 (×3): 25 mg via ORAL
  Filled 2019-03-17 (×3): qty 1

## 2019-03-17 MED ORDER — ADULT MULTIVITAMIN W/MINERALS CH
1.0000 | ORAL_TABLET | Freq: Every day | ORAL | Status: DC
  Administered 2019-03-17 – 2019-03-18 (×2): 1 via ORAL
  Filled 2019-03-17 (×2): qty 1

## 2019-03-17 NOTE — Consult Note (Signed)
Referring Provider: Dr. Wyline Copas Primary Care Physician:  Kelton Pillar, MD Primary Gastroenterologist:  Althia Forts  Reason for Consultation:  GI bleed  HPI: Dorothy Noble is a 85 y.o. female with multiple medical problems admitted for rectal bleeding with history obtained from chart because patient only able to tell me she has had several episodes of rectal bleeding yesterday. Blood described as maroon-colored. Denies any associated abdominal pain, N/V. Smear of blood with wiping overnight in ER. Patient reports having a colonoscopy in the past but does not know when or what it showed. Hgb 11.7 (14.2 in March 2020). Feels ok.  Past Medical History:  Diagnosis Date  . Arthritis   . Carotid artery occlusion   . Herniated disc   . Hypertension   . Stroke [facility])     Past Surgical History:  Procedure Laterality Date  . Blood clot removed from R ankle    . Carotid endarderectomy    . CAROTID ENDARTERECTOMY Right 05-14-09   cea    Prior to Admission medications   Medication Sig Start Date End Date Taking? Authorizing Provider  aspirin EC 81 MG tablet Take 81 mg by mouth daily.     Yes [provider]  BIOTIN PO Take 1 tablet by mouth daily with breakfast.   Yes [provider]  Dextran 70-Hypromellose (ARTIFICIAL TEARS PF OP) Place 1-2 drops into both eyes 3 (three) times daily as needed (for dryness).   Yes [provider]  hydrALAZINE (APRESOLINE) 25 MG tablet Take 1 tablet (25 mg total) by mouth 3 (three) times daily. Patient taking differently: Take 25 mg by mouth 2 (two) times a day.  06/26/15  Yes Elgergawy, Silver Huguenin, MD  Multiple Vitamins-Minerals (PRESERVISION AREDS 2) CAPS Take 1 capsule by mouth daily with breakfast.   Yes [provider]  naproxen sodium (ALEVE) 220 MG tablet Take 220 mg by mouth at bedtime as needed (for arthritic pain).   Yes [provider]  Omega-3 Fatty Acids (FISH OIL) 1000 MG CAPS Take 1,000 mg by mouth daily with  breakfast.    Yes [provider]  Red Yeast Rice Extract (RED YEAST RICE PO) Take 2 tablets by mouth daily with breakfast.    Yes [provider]  feeding supplement, ENSURE ENLIVE, (ENSURE ENLIVE) LIQD Take 237 mLs by mouth 3 (three) times daily between meals. Patient not taking: Reported on 03/16/2019 06/26/15   Elgergawy, Silver Huguenin, MD  megestrol (MEGACE) 400 MG/10ML suspension Take 10 mLs (400 mg total) by mouth daily. Patient not taking: Reported on 03/16/2019 06/26/15   Elgergawy, Silver Huguenin, MD    Scheduled Meds: . hydrALAZINE  25 mg Oral TID   Continuous Infusions: PRN Meds:.acetaminophen **OR** acetaminophen, hydrALAZINE  Allergies as of 03/16/2019  . (No Known Allergies)    Family History  Problem Relation Age of Onset  . Hyperlipidemia Daughter   . Hypertension Daughter   . Other Daughter        varicose veins  . Varicose Veins Daughter   . Cancer Sister        Breast  . Hyperlipidemia Sister   . Hypertension Sister   . Hypertension Daughter   . Hyperlipidemia Daughter     Social History   Socioeconomic History  . Marital status: Widowed    Spouse name: Not on file  . Number of children: Not on file  . Years of education: Not on file  . Highest education level: Not on file  Occupational History  . Not  on file  Social Needs  . Financial resource strain: Not on file  . Food insecurity    Worry: Not on file    Inability: Not on file  . Transportation needs    Medical: Not on file    Non-medical: Not on file  Tobacco Use  . Smoking status: Never Smoker  . Smokeless tobacco: Never Used  Substance and Sexual Activity  . Alcohol use: No  . Drug use: No  . Sexual activity: Never    Birth control/protection: Post-menopausal  Lifestyle  . Physical activity    Days per week: Not on file    Minutes per session: Not on file  . Stress: Not on file  Relationships  . Social Herbalist on phone: Not on file    Gets together: Not on  file    Attends religious service: Not on file    Active member of club or organization: Not on file    Attends meetings of clubs or organizations: Not on file    Relationship status: Not on file  . Intimate partner violence    Fear of current or ex partner: Not on file    Emotionally abused: Not on file    Physically abused: Not on file    Forced sexual activity: Not on file  Other Topics Concern  . Not on file  Social History Narrative  . Not on file    Review of Systems: All negative except as stated above in HPI.  Physical Exam: Vital signs: Vitals:   03/17/19 0600 03/17/19 0727  BP: (!) 181/111 (!) 126/58  Pulse: (S) (!) 121 79  Resp: (!) 22   Temp:    SpO2: 95%   T 98   General:   Elderly, pleasant, well-nourished, no acute distress Head: normocephalic, atraumatic Eyes: anicteric sclera ENT: oropharynx clear Neck: supple, nontender Lungs:  Clear throughout to auscultation.   No wheezes, crackles, or rhonchi. No acute distress. Heart:  Regular rate and rhythm; no murmurs, clicks, rubs,  or gallops. Abdomen: diffuse tenderness with guarding, soft, nondistended, +BS  Rectal:  Deferred Ext: no edema  GI:  Lab Results: Recent Labs    03/16/19 1531 03/17/19 0605  WBC 6.2 8.7  HGB 11.7* 11.1*  HCT 36.4 35.6*  PLT 169 161   BMET Recent Labs    03/16/19 1531 03/17/19 0605  NA 141 142  K 4.8 4.6  CL 109 110  CO2 21* 23  GLUCOSE 92 108*  BUN 41* 42*  CREATININE 1.60* 1.55*  CALCIUM 9.8 9.6   LFT Recent Labs    03/17/19 0605  PROT 6.4*  ALBUMIN 3.2*  AST 20  ALT 15  ALKPHOS 58  BILITOT 0.6   PT/INR No results for input(s): LABPROT, INR in the last 72 hours.   Studies/Results: No results found.  Impression/Plan: 85 yo with maroon-colored stool likely due to a diverticular bleed but ischemic colitis and malignancy also in the differential. Non-contrast CT of abd/pelvis ordered due to diffuse abdominal tenderness. Check lipase. Supportive  care. If bleeding continues, then may need an updated colonoscopy but otherwise due to her advanced age and comorbidities would manage conservatively.    LOS: 0 days   [name]  03/17/2019, 8:09 AM  Questions please call [phone]

## 2019-03-17 NOTE — ED Notes (Signed)
RN navigator spoke with patient's daughter Enid Derry. Updated her that patient has a ready bed on 6C and would be moving up shortly. Pt daughter appreciated the update.

## 2019-03-17 NOTE — ED Notes (Signed)
Pt will say "my heart is hurting" but given context this means pt's feelings are what are hurting.

## 2019-03-17 NOTE — ED Notes (Signed)
Pt did not want breakfast when it was offered

## 2019-03-17 NOTE — ED Notes (Signed)
Clear liquid lunch tray ordered 

## 2019-03-17 NOTE — ED Notes (Signed)
Pt keeps stating that she wants her daughters called and she wants to talk to them. Pt was told that its too early tocall them. Tech told pt that we will call them around 8am. Pt was combative when nurse and tech were trying to draw blood. Pt was trying to hit and wanting to get out of bed. Pt has had to be redirected multiple times. Pt is awake at this time.

## 2019-03-17 NOTE — Progress Notes (Signed)
Initial Nutrition Assessment  RD working remotely.  DOCUMENTATION CODES:   Obesity unspecified  INTERVENTION:   -MVI with minerals daily -Boost Breeze po TID, each supplement provides 250 kcal and 9 grams of protein -RD will follow for diet advancement and adjust supplement regimen as appropriate  NUTRITION DIAGNOSIS:   Inadequate oral intake related to altered GI function as evidenced by (clear liquid diet).  GOAL:   Patient will meet greater than or equal to 90% of their needs  MONITOR:   PO intake, Supplement acceptance, Diet advancement, Labs, Weight trends, Skin, I & O's  REASON FOR ASSESSMENT:   Consult Assessment of nutrition requirement/status  ASSESSMENT:   Dorothy Noble is a 85 y.o. female with medical history significant of HTN , prior CVA, arthritis who presents to the ED with melanotic stools that started one day prior to ED visit with another episode on the day of visit. Of note, patient is poor historian. Only reports loose stools recently and trying "not to soil the bed." Denies abd pain, fevers, sweats, chills. Admits to "coughing" but describes as clearing her throat.  Pt admitted with acute blood loss anemia secondary to suspected lower GIB.   Reviewed I/O's: 0 ml x 24 hours  Attempted to speak with pt via phone, however, no answer.   Per GI notes, plan to manage conservatively.   Reviewed wt hx; noted pt with modest weight gain over the past few years. Per last RD note in 2016, pt was identified with moderate malnutrition of chronic illness, so wt gain is favorable. Per PTA medications, pt also takes megace and Ensure.   Pt currently on clear liquid diet and will be unable to meet nutritional needs due to restrictions of diet. RD will add supplements to help pt meet nutritional needs during this time.   Albumin has a half-life of 21 days and is strongly affected by stress response and inflammatory process, therefore, do not expect to see an improvement  in this lab value during acute hospitalization. When a patient presents with low albumin, it is likely skewed due to the acute inflammatory response.  Unless it is suspected that patient had poor PO intake or malnutrition prior to admission, then RD should not be consulted solely for low albumin. Note that low albumin is no longer used to diagnose malnutrition; [place] uses the new malnutrition guidelines published by the American Society for Parenteral and Enteral Nutrition (A.S.P.E.N.) and the Academy of Nutrition and Dietetics (AND).    Labs reviewed.   NUTRITION - FOCUSED PHYSICAL EXAM:    Most Recent Value  Orbital Region  Unable to assess  Upper Arm Region  Unable to assess  Thoracic and Lumbar Region  Unable to assess  Buccal Region  Unable to assess  Temple Region  Unable to assess  Clavicle Bone Region  Unable to assess  Clavicle and Acromion Bone Region  Unable to assess  Scapular Bone Region  Unable to assess  Dorsal Hand  Unable to assess  Patellar Region  Unable to assess  Anterior Thigh Region  Unable to assess  Posterior Calf Region  Unable to assess  Edema (RD Assessment)  Unable to assess  Hair  Unable to assess  Eyes  Unable to assess  Mouth  Unable to assess  Skin  Unable to assess  Nails  Unable to assess       Diet Order:   Diet Order            Diet clear  liquid Room service appropriate? Yes; Fluid consistency: Thin  Diet effective now              EDUCATION NEEDS:   No education needs have been identified at this time  Skin:  Skin Assessment: Reviewed RN Assessment  Last BM:  03/17/19  Height:   Ht Readings from Last 1 Encounters:  03/17/19 5\' 2"  (1.575 m)    Weight:   Wt Readings from Last 1 Encounters:  03/16/19 74.8 kg    Ideal Body Weight:  50 kg  BMI:  Body mass index is 30.18 kg/m.  Estimated Nutritional Needs:   Kcal:  1650-1850  Protein:  80-95 grams  Fluid:  1.6-1.8 L   Gottlieb Zuercher A. Jimmye Norman, RD, LDN,  [place] Registered Dietitian II Certified Diabetes Care and Education Specialist Pager: [phone] After hours Pager: [phone]

## 2019-03-17 NOTE — Progress Notes (Addendum)
PROGRESS NOTE    Dorothy Noble  HFW:[id number] DOB: 1922/06/29 DOA: 03/16/2019 PCP: Kelton Pillar, MD    Brief Narrative:  85 y.o. female with medical history significant of HTN , prior CVA, arthritis who presents to the ED with melanotic stools that started one day prior to ED visit with another episode on the day of visit. Of note, patient is poor historian. Only reports loose stools recently and trying "not to soil the bed." Denies abd pain, fevers, sweats, chills. Admits to "coughing" but describes as clearing her throat.  ED Course: In the ED, pt noted to have hgb of 11.7 (was over 14 three months prior). Stools noted to be heme pos. GI consulted by EDP. Hospitalist consulted for consideration for medical admission  Assessment & Plan:   Principal Problem:   Acute blood loss anemia Active Problems:   Malnutrition of moderate degree (HCC)   Lower GI bleed   Stage 3b chronic kidney disease (HCC)   GI bleed  1. Acute blood loss anemia from suspected lower GI bleed 1. Presenting hgb 11.7, was 14.2 three months prior, Hgb stable overnight 2. Bloody stools noted recently 3. ASA remains on hold for now 4. GI consulted. CT abd personally reviewed 2. Obesity 1. Nutrition was consulted 3. Stage 3b CKD 1. Renal function reviewed, is near baseline 2. Recheck bmet in AM 4. HTN 1. BP presently suboptimally controlled 2. Continue on home hydralazine 3. Cont on PRN hydralazine IV 5. Hx CVA 1. Presently stable 2. ASA held for now given concern of acute blood loss anemia 3. Would resume ASA if/when GI bleeding resolves and pt cleared to resume ASA  DVT prophylaxis: SCD's Code Status: Full Family Communication: Pt in room, family not at bedside Disposition Plan: Possible d/c home tomorrow  Consultants:   GI  Procedures:     Antimicrobials: Anti-infectives (From admission, onward)   None       Subjective: Without complaints at this time  Objective: Vitals:   03/17/19 0727 03/17/19 0927 03/17/19 1124 03/17/19 1212  BP: (!) 126/58 (!) 128/55 (!) 150/78 (!) 159/66  Pulse: 79 75 87 81  Resp:   16 19  Temp:   98.3 F (36.8 C) 98 F (36.7 C)  TempSrc:    Oral  SpO2:   100% 98%  Weight:      Height:    5\' 2"  (1.575 m)   No intake or output data in the 24 hours ending 03/17/19 1407 Filed Weights   03/16/19 1509  Weight: 74.8 kg    Examination:  General exam: Appears calm and comfortable  Respiratory system: Clear to auscultation. Respiratory effort normal. Cardiovascular system: S1 & S2 heard, RRR Gastrointestinal system: Abdomen is nondistended, soft and nontender. No organomegaly or masses felt. Normal bowel sounds heard. Central nervous system: Alert and oriented. No focal neurological deficits. Extremities: Symmetric 5 x 5 power. Skin: No rashes, lesions  Psychiatry: Mildy confused this AM. Mood & affect appropriate.   Data Reviewed: I have personally reviewed following labs and imaging studies  CBC: Recent Labs  Lab 03/16/19 1531 03/17/19 0605  WBC 6.2 8.7  NEUTROABS 3.0  --   HGB 11.7* 11.1*  HCT 36.4 35.6*  MCV 98.4 100.6*  PLT 169 027   Basic Metabolic Panel: Recent Labs  Lab 03/16/19 1531 03/17/19 0605  NA 141 142  K 4.8 4.6  CL 109 110  CO2 21* 23  GLUCOSE 92 108*  BUN 41* 42*  CREATININE 1.60* 1.55*  CALCIUM  9.8 9.6   GFR: Estimated Creatinine Clearance: 20.6 mL/min (A) (by C-G formula based on SCr of 1.55 mg/dL (H)). Liver Function Tests: Recent Labs  Lab 03/16/19 1531 03/17/19 0605  AST 18 20  ALT 14 15  ALKPHOS 62 58  BILITOT 0.5 0.6  PROT 7.3 6.4*  ALBUMIN 3.6 3.2*   Recent Labs  Lab 03/17/19 0605  LIPASE 32   No results for input(s): AMMONIA in the last 168 hours. Coagulation Profile: No results for input(s): INR, PROTIME in the last 168 hours. Cardiac Enzymes: No results for input(s): CKTOTAL, CKMB, CKMBINDEX, TROPONINI in the last 168 hours. BNP (last 3 results) No results for  input(s): PROBNP in the last 8760 hours. HbA1C: No results for input(s): HGBA1C in the last 72 hours. CBG: No results for input(s): GLUCAP in the last 168 hours. Lipid Profile: No results for input(s): CHOL, HDL, LDLCALC, TRIG, CHOLHDL, LDLDIRECT in the last 72 hours. Thyroid Function Tests: No results for input(s): TSH, T4TOTAL, FREET4, T3FREE, THYROIDAB in the last 72 hours. Anemia Panel: No results for input(s): VITAMINB12, FOLATE, FERRITIN, TIBC, IRON, RETICCTPCT in the last 72 hours. Sepsis Labs: No results for input(s): PROCALCITON, LATICACIDVEN in the last 168 hours.  Recent Results (from the past 240 hour(s))  SARS Coronavirus 2 (CEPHEID - Performed in [place] hospital lab), Hosp Order     Status: None   Collection Time: 03/16/19  4:59 PM   Specimen: Nasopharyngeal Swab  Result Value Ref Range Status   SARS Coronavirus 2 NEGATIVE NEGATIVE Final    Comment: (NOTE) If result is NEGATIVE SARS-CoV-2 target nucleic acids are NOT DETECTED. The SARS-CoV-2 RNA is generally detectable in upper and lower  respiratory specimens during the acute phase of infection. The lowest  concentration of SARS-CoV-2 viral copies this assay can detect is 250  copies / mL. A negative result does not preclude SARS-CoV-2 infection  and should not be used as the sole basis for treatment or other  patient management decisions.  A negative result may occur with  improper specimen collection / handling, submission of specimen other  than nasopharyngeal swab, presence of viral mutation(s) within the  areas targeted by this assay, and inadequate number of viral copies  (<250 copies / mL). A negative result must be combined with clinical  observations, patient history, and epidemiological information. If result is POSITIVE SARS-CoV-2 target nucleic acids are DETECTED. The SARS-CoV-2 RNA is generally detectable in upper and lower  respiratory specimens dur ing the acute phase of infection.  Positive   results are indicative of active infection with SARS-CoV-2.  Clinical  correlation with patient history and other diagnostic information is  necessary to determine patient infection status.  Positive results do  not rule out bacterial infection or co-infection with other viruses. If result is PRESUMPTIVE POSTIVE SARS-CoV-2 nucleic acids MAY BE PRESENT.   A presumptive positive result was obtained on the submitted specimen  and confirmed on repeat testing.  While 2019 novel coronavirus  (SARS-CoV-2) nucleic acids may be present in the submitted sample  additional confirmatory testing may be necessary for epidemiological  and / or clinical management purposes  to differentiate between  SARS-CoV-2 and other Sarbecovirus currently known to infect humans.  If clinically indicated additional testing with an alternate test  methodology [id number]) is advised. The SARS-CoV-2 RNA is generally  detectable in upper and lower respiratory sp ecimens during the acute  phase of infection. The expected result is Negative. Fact Sheet for Patients:  StrictlyIdeas.no Fact  Sheet for Healthcare Providers: BankingDealers.co.za This test is not yet approved or cleared by the [place] and has been authorized for detection and/or diagnosis of SARS-CoV-2 by FDA under an Emergency Use Authorization (EUA).  This EUA will remain in effect (meaning this test can be used) for the duration of the COVID-19 declaration under Section 564(b)(1) of the Act, 21 U.S.C. section 360bbb-3(b)(1), unless the authorization is terminated or revoked sooner. Performed at [facility], [place] [address], [place], [place] [address]      Radiology Studies: Ct Abdomen Pelvis Wo Contrast  Result Date: 03/17/2019 CLINICAL DATA:  GI bleeding with abdominal pain EXAM: CT ABDOMEN AND PELVIS WITHOUT CONTRAST TECHNIQUE: Multidetector CT imaging of the abdomen and pelvis was  performed following the standard protocol without IV contrast. COMPARISON:  None. FINDINGS: Lower chest: Calcified granuloma identified in the right middle lobe. Hepatobiliary: No focal liver abnormality is seen. No gallstones, gallbladder wall thickening, or biliary dilatation. Pancreas: Unremarkable. No pancreatic ductal dilatation or surrounding inflammatory changes. Spleen: Normal in size without focal abnormality. Adrenals/Urinary Tract: Normal appearance of the adrenal glands. Small nonobstructing stone within upper pole of left kidney measures 2 mm. No right kidney stone. No hydronephrosis or mass noted bilaterally. Urinary bladder normal. Stomach/Bowel: Stomach appears normal. No dilated loops of small bowel. The appendix is visualized and appears normal. Numerous colonic diverticula are identified throughout the colon. No wall thickening or inflammation identified. Vascular/Lymphatic: Extensive aortic atherosclerosis with branch vessel disease identified. 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 No abdominopelvic adenopathy identified. No inguinal adenopathy. Reproductive: Uterus and bilateral adnexa are unremarkable. Other: No free fluid or fluid collections. Musculoskeletal: The bones appear osteopenic. There are compression deformities involving L3 and L4. 50% loss of height at L3 and approximately 20% superior endplate height loss at L4. Multi level degenerative disc disease is noted. This is most advanced at L3-4 where there is vacuum disc phenomenon and a first degree anterolisthesis of L3 on L4. IMPRESSION: 1. No acute findings within the abdomen or pelvis. 2. Extensive colonic diverticulosis without acute inflammation. 3. Nonobstructing left renal calculus. 4. Extensive aortic atherosclerosis with branch vessel disease and peripherally calcified road renal artery aneurysm. Aortic Atherosclerosis (ICD10-I70.0). 5. Age-indeterminate L3 and L4 compression  deformities. 6. Lumbar spondylosis Electronically Signed   By: Kerby Moors M.D.   On: 03/17/2019 09:36    Scheduled Meds: . hydrALAZINE  25 mg Oral TID   Continuous Infusions:   LOS: 0 days   Marylu Lund, MD [facility] Pager On Amion  If 7PM-7AM, please contact night-coverage 03/17/2019, 2:07 PM
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
## 2019-03-17 NOTE — ED Notes (Signed)
Pt's breif assessed for dryness, appears dry at this time and pt states she feels dry

## 2019-03-18 ENCOUNTER — Encounter (HOSPITAL_COMMUNITY): Payer: Self-pay | Admitting: General Practice

## 2019-03-18 DIAGNOSIS — K922 Gastrointestinal hemorrhage, unspecified: Secondary | ICD-10-CM

## 2019-03-18 DIAGNOSIS — D62 Acute posthemorrhagic anemia: Secondary | ICD-10-CM | POA: Diagnosis not present

## 2019-03-18 DIAGNOSIS — N183 Chronic kidney disease, stage 3 (moderate): Secondary | ICD-10-CM | POA: Diagnosis not present

## 2019-03-18 DIAGNOSIS — E440 Moderate protein-calorie malnutrition: Secondary | ICD-10-CM | POA: Diagnosis not present

## 2019-03-18 HISTORY — DX: Gastrointestinal hemorrhage, unspecified: K92.2

## 2019-03-18 LAB — CBC
HCT: 37.7 % (ref 36.0–46.0)
Hemoglobin: 12.1 g/dL (ref 12.0–15.0)
MCH: 30.8 pg (ref 26.0–34.0)
MCHC: 32.1 g/dL (ref 30.0–36.0)
MCV: 95.9 fL (ref 80.0–100.0)
Platelets: 186 10*3/uL (ref 150–400)
RBC: 3.93 MIL/uL (ref 3.87–5.11)
RDW: 14.2 % (ref 11.5–15.5)
WBC: 6.7 10*3/uL (ref 4.0–10.5)
nRBC: 0 % (ref 0.0–0.2)

## 2019-03-18 MED ORDER — DOCUSATE SODIUM 100 MG PO CAPS: 100.0000 mg | ORAL_CAPSULE | Freq: Two times a day (BID) | ORAL | 2 refills | Status: AC

## 2019-03-18 NOTE — Discharge Summary (Signed)
Physician Discharge Summary  Dorothy Noble DTO:[id number] DOB: Apr 11, 1922 DOA: 03/16/2019  PCP: Kelton Pillar, MD  Admit date: 03/16/2019 Discharge date: 03/18/2019  Admitted From: Home Disposition:  Home  Recommendations for Outpatient Follow-up:  1. Follow up with PCP in 1-2 weeks 2. Recommend repeat CBC in 1-2 weeks as outpatient  Discharge Condition:Stable CODE STATUS:Full Diet recommendation: Regular   Brief/Interim Summary: 85 y.o.femalewith medical history significant ofHTN , prior CVA, arthritis who presents to the ED with melanotic stools that started one day prior to ED visit with another episode on the day of visit.Of note, patient is poor historian. Only reports loose stools recently and trying "not to soil the bed." Denies abd pain, fevers, sweats, chills. Admits to "coughing" but describes as clearing her throat.  ED Course:In the ED, pt noted to have hgb of 11.7 (was over 14 three months prior). Stools noted to be heme pos. GI consulted by EDP. Hospitalist consulted for consideration for medical admission  Discharge Diagnoses:  Principal Problem:   Acute blood loss anemia Active Problems:   Lower GI bleed   Stage 3b chronic kidney disease (HCC)   GI bleed   Obesity  1. Acute blood loss anemia from suspected lower GI bleed 1. Presenting hgb 11.7, was 14.2 three months prior, Hgb stable overnight 2. Bloody stools noted recently 3. ASA initially on hold, discussed with GI, OK to resume 4. GI consulted. CT abd personally reviewed findings significant for diverticulosis, likely source of bleed 5. Hgb has remained stable, higher at time of discharge 6. Discussed with GI. OK to d/c home today 7. Will prescribe stool softener 2. Obesity 1. Nutrition was consulted 3. Stage 3b CKD 1. Renal function reviewed, is near baseline 4. HTN 1. BP presently suboptimally controlled 2. Continue on home hydralazine 5. Hx CVA 1. Presently stable 2. ASA held initially given  concern of acute blood loss anemia 3. OK to resume ASA on discharge per GI  Discharge Instructions   Allergies as of 03/18/2019   No Known Allergies     Medication List    STOP taking these medications   megestrol 400 MG/10ML suspension Commonly known as: MEGACE   naproxen sodium 220 MG tablet Commonly known as: ALEVE     TAKE these medications   ARTIFICIAL TEARS PF OP Place 1-2 drops into both eyes 3 (three) times daily as needed (for dryness).   aspirin EC 81 MG tablet Take 81 mg by mouth daily.   BIOTIN PO Take 1 tablet by mouth daily with breakfast.   feeding supplement (ENSURE ENLIVE) Liqd Take 237 mLs by mouth 3 (three) times daily between meals.   Fish Oil 1000 MG Caps Take 1,000 mg by mouth daily with breakfast.   hydrALAZINE 25 MG tablet Commonly known as: APRESOLINE Take 1 tablet (25 mg total) by mouth 3 (three) times daily. What changed: when to take this   PreserVision AREDS 2 Caps Take 1 capsule by mouth daily with breakfast.   RED YEAST RICE PO Take 2 tablets by mouth daily with breakfast.      Follow-up Information    Kelton Pillar, MD. Schedule an appointment as soon as possible for a visit in 2 week(s).   Specialty: Family Medicine Contact information: [address] [place] [address] [phone]          No Known Allergies  Consultations:  GI  Procedures/Studies: Ct Abdomen Pelvis Wo Contrast  Result Date: 03/17/2019 CLINICAL DATA:  GI bleeding with abdominal  pain EXAM: CT ABDOMEN AND PELVIS WITHOUT CONTRAST TECHNIQUE: Multidetector CT imaging of the abdomen and pelvis was performed following the standard protocol without IV contrast. COMPARISON:  None. FINDINGS: Lower chest: Calcified granuloma identified in the right middle lobe. Hepatobiliary: No focal liver abnormality is seen. No gallstones, gallbladder wall thickening, or biliary dilatation. Pancreas: Unremarkable. No pancreatic ductal dilatation or  surrounding inflammatory changes. Spleen: Normal in size without focal abnormality. Adrenals/Urinary Tract: Normal appearance of the adrenal glands. Small nonobstructing stone within upper pole of left kidney measures 2 mm. No right kidney stone. No hydronephrosis or mass noted bilaterally. Urinary bladder normal. Stomach/Bowel: Stomach appears normal. No dilated loops of small bowel. The appendix is visualized and appears normal. Numerous colonic diverticula are identified throughout the colon. No wall thickening or inflammation identified. Vascular/Lymphatic: Extensive aortic atherosclerosis with branch vessel disease identified. 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 No abdominopelvic adenopathy identified. No inguinal adenopathy. Reproductive: Uterus and bilateral adnexa are unremarkable. Other: No free fluid or fluid collections. Musculoskeletal: The bones appear osteopenic. There are compression deformities involving L3 and L4. 50% loss of height at L3 and approximately 20% superior endplate height loss at L4. Multi level degenerative disc disease is noted. This is most advanced at L3-4 where there is vacuum disc phenomenon and a first degree anterolisthesis of L3 on L4. IMPRESSION: 1. No acute findings within the abdomen or pelvis. 2. Extensive colonic diverticulosis without acute inflammation. 3. Nonobstructing left renal calculus. 4. Extensive aortic atherosclerosis with branch vessel disease and peripherally calcified road renal artery aneurysm. Aortic Atherosclerosis (ICD10-I70.0). 5. Age-indeterminate L3 and L4 compression deformities. 6. Lumbar spondylosis Electronically Signed   By: Kerby Moors M.D.   On: 03/17/2019 09:36     Subjective: Pleasant this AM  Discharge Exam: Vitals:   03/18/19 0546 03/18/19 0805  BP: (!) 146/63 (!) 143/66  Pulse: 75 64  Resp: 20 18  Temp: 97.8 F (36.6 C) 97.7 F (36.5 C)  SpO2: 98% 98%   Vitals:    03/17/19 1212 03/17/19 2143 03/18/19 0546 03/18/19 0805  BP: (!) 159/66 (!) 165/78 (!) 146/63 (!) 143/66  Pulse: 81 75 75 64  Resp: 19 20 20 18   Temp: 98 F (36.7 C) 98.2 F (36.8 C) 97.8 F (36.6 C) 97.7 F (36.5 C)  TempSrc: Oral Oral Oral Oral  SpO2: 98% 98% 98% 98%  Weight:   60.3 kg   Height: 5\' 2"  (1.575 m)       General: Pt is alert, awake, not in acute distress Cardiovascular: RRR, S1/S2 +, no rubs, no gallops Respiratory: CTA bilaterally, no wheezing, no rhonchi Abdominal: Soft, NT, ND, bowel sounds + Extremities: no edema, no cyanosis   The results of significant diagnostics from this hospitalization (including imaging, microbiology, ancillary and laboratory) are listed below for reference.     Microbiology: Recent Results (from the past 240 hour(s))  SARS Coronavirus 2 (CEPHEID - Performed in [facility] hospital lab), Hosp Order     Status: None   Collection Time: 03/16/19  4:59 PM   Specimen: Nasopharyngeal Swab  Result Value Ref Range Status   SARS Coronavirus 2 NEGATIVE NEGATIVE Final    Comment: (NOTE) If result is NEGATIVE SARS-CoV-2 target nucleic acids are NOT DETECTED. The SARS-CoV-2 RNA is generally detectable in upper and lower  respiratory specimens during the acute phase of infection. The lowest  concentration of SARS-CoV-2 viral copies this assay can detect is 250  copies / mL.  A negative result does not preclude SARS-CoV-2 infection  and should not be used as the sole basis for treatment or other  patient management decisions.  A negative result may occur with  improper specimen collection / handling, submission of specimen other  than nasopharyngeal swab, presence of viral mutation(s) within the  areas targeted by this assay, and inadequate number of viral copies  (<250 copies / mL). A negative result must be combined with clinical  observations, patient history, and epidemiological information. If result is POSITIVE SARS-CoV-2 target nucleic  acids are DETECTED. The SARS-CoV-2 RNA is generally detectable in upper and lower  respiratory specimens dur ing the acute phase of infection.  Positive  results are indicative of active infection with SARS-CoV-2.  Clinical  correlation with patient history and other diagnostic information is  necessary to determine patient infection status.  Positive results do  not rule out bacterial infection or co-infection with other viruses. If result is PRESUMPTIVE POSTIVE SARS-CoV-2 nucleic acids MAY BE PRESENT.   A presumptive positive result was obtained on the submitted specimen  and confirmed on repeat testing.  While 2019 novel coronavirus  (SARS-CoV-2) nucleic acids may be present in the submitted sample  additional confirmatory testing may be necessary for epidemiological  and / or clinical management purposes  to differentiate between  SARS-CoV-2 and other Sarbecovirus currently known to infect humans.  If clinically indicated additional testing with an alternate test  methodology [phone]) is advised. The SARS-CoV-2 RNA is generally  detectable in upper and lower respiratory sp ecimens during the acute  phase of infection. The expected result is Negative. Fact Sheet for Patients:  StrictlyIdeas.no Fact Sheet for Healthcare Providers: BankingDealers.co.za This test is not yet approved or cleared by the [place] FDA and has been authorized for detection and/or diagnosis of SARS-CoV-2 by FDA under an Emergency Use Authorization (EUA).  This EUA will remain in effect (meaning this test can be used) for the duration of the COVID-19 declaration under Section 564(b)(1) of the Act, 21 U.S.C. section 360bbb-3(b)(1), unless the authorization is terminated or revoked sooner. Performed at [facility], [place] [address] [place], [place] [address]      Labs: BNP (last 3 results) No results for input(s): BNP in the last 8760  hours. Basic Metabolic Panel: Recent Labs  Lab 03/16/19 1531 03/17/19 0605  NA 141 142  K 4.8 4.6  CL 109 110  CO2 21* 23  GLUCOSE 92 108*  BUN 41* 42*  CREATININE 1.60* 1.55*  CALCIUM 9.8 9.6   Liver Function Tests: Recent Labs  Lab 03/16/19 1531 03/17/19 0605  AST 18 20  ALT 14 15  ALKPHOS 62 58  BILITOT 0.5 0.6  PROT 7.3 6.4*  ALBUMIN 3.6 3.2*   Recent Labs  Lab 03/17/19 0605  LIPASE 32   No results for input(s): AMMONIA in the last 168 hours. CBC: Recent Labs  Lab 03/16/19 1531 03/17/19 0605 03/18/19 0803  WBC 6.2 8.7 6.7  NEUTROABS 3.0  --   --   HGB 11.7* 11.1* 12.1  HCT 36.4 35.6* 37.7  MCV 98.4 100.6* 95.9  PLT 169 161 186   Cardiac Enzymes: No results for input(s): CKTOTAL, CKMB, CKMBINDEX, TROPONINI in the last 168 hours. BNP: Invalid input(s): POCBNP CBG: No results for input(s): GLUCAP in the last 168 hours. D-Dimer No results for input(s): DDIMER in the last 72 hours. Hgb A1c No results for input(s): HGBA1C in the last 72 hours. Lipid Profile No results for  input(s): CHOL, HDL, LDLCALC, TRIG, CHOLHDL, LDLDIRECT in the last 72 hours. Thyroid function studies No results for input(s): TSH, T4TOTAL, T3FREE, THYROIDAB in the last 72 hours.  Invalid input(s): FREET3 Anemia work up No results for input(s): VITAMINB12, FOLATE, FERRITIN, TIBC, IRON, RETICCTPCT in the last 72 hours. Urinalysis    Component Value Date/Time   COLORURINE STRAW (A) 11/25/2018 0942   APPEARANCEUR CLEAR 11/25/2018 0942   LABSPEC 1.009 11/25/2018 0942   PHURINE 8.0 11/25/2018 0942   GLUCOSEU NEGATIVE 11/25/2018 0942   HGBUR NEGATIVE 11/25/2018 0942   BILIRUBINUR NEGATIVE 11/25/2018 0942   KETONESUR NEGATIVE 11/25/2018 0942   PROTEINUR NEGATIVE 11/25/2018 0942   UROBILINOGEN 0.2 06/24/2015 1215   NITRITE NEGATIVE 11/25/2018 0942   LEUKOCYTESUR NEGATIVE 11/25/2018 0942   Sepsis Labs Invalid input(s): PROCALCITONIN,  WBC,  LACTICIDVEN Microbiology Recent  Results (from the past 240 hour(s))  SARS Coronavirus 2 (CEPHEID - Performed in [facility] hospital lab), Hosp Order     Status: None   Collection Time: 03/16/19  4:59 PM   Specimen: Nasopharyngeal Swab  Result Value Ref Range Status   SARS Coronavirus 2 NEGATIVE NEGATIVE Final    Comment: (NOTE) If result is NEGATIVE SARS-CoV-2 target nucleic acids are NOT DETECTED. The SARS-CoV-2 RNA is generally detectable in upper and lower  respiratory specimens during the acute phase of infection. The lowest  concentration of SARS-CoV-2 viral copies this assay can detect is 250  copies / mL. A negative result does not preclude SARS-CoV-2 infection  and should not be used as the sole basis for treatment or other  patient management decisions.  A negative result may occur with  improper specimen collection / handling, submission of specimen other  than nasopharyngeal swab, presence of viral mutation(s) within the  areas targeted by this assay, and inadequate number of viral copies  (<250 copies / mL). A negative result must be combined with clinical  observations, patient history, and epidemiological information. If result is POSITIVE SARS-CoV-2 target nucleic acids are DETECTED. The SARS-CoV-2 RNA is generally detectable in upper and lower  respiratory specimens dur ing the acute phase of infection.  Positive  results are indicative of active infection with SARS-CoV-2.  Clinical  correlation with patient history and other diagnostic information is  necessary to determine patient infection status.  Positive results do  not rule out bacterial infection or co-infection with other viruses. If result is PRESUMPTIVE POSTIVE SARS-CoV-2 nucleic acids MAY BE PRESENT.   A presumptive positive result was obtained on the submitted specimen  and confirmed on repeat testing.  While 2019 novel coronavirus  (SARS-CoV-2) nucleic acids may be present in the submitted sample  additional confirmatory testing may  be necessary for epidemiological  and / or clinical management purposes  to differentiate between  SARS-CoV-2 and other Sarbecovirus currently known to infect humans.  If clinically indicated additional testing with an alternate test  methodology [phone]) is advised. The SARS-CoV-2 RNA is generally  detectable in upper and lower respiratory sp ecimens during the acute  phase of infection. The expected result is Negative. Fact Sheet for Patients:  StrictlyIdeas.no Fact Sheet for Healthcare Providers: BankingDealers.co.za This test is not yet approved or cleared by the [place] FDA and has been authorized for detection and/or diagnosis of SARS-CoV-2 by FDA under an Emergency Use Authorization (EUA).  This EUA will remain in effect (meaning this test can be used) for the duration of the COVID-19 declaration under Section 564(b)(1) of the Act, 21 U.S.C. section 360bbb-3(b)(1),  unless the authorization is terminated or revoked sooner. Performed at [facility], [place] [address], [place], [place] [address]    Time spent: 30 min  SIGNED:   Marylu Lund, MD  [facility] 03/18/2019, 10:18 AM  If 7PM-7AM, please contact night-coverage

## 2019-03-18 NOTE — Progress Notes (Signed)
[facility] Progress Note  Dorothy Noble 85 y.o. 1921-11-05   Subjective: Maroon-colored stool on toilet tissue (stool in toilet not visualized by staff because patient stated it was malodorous and was flushed) this morning per sitter. Patient denies abdominal pain. Feels ok.  Objective: Vital signs: Vitals:   03/18/19 0546 03/18/19 0805  BP: (!) 146/63 (!) 143/66  Pulse: 75 64  Resp: 20 18  Temp: 97.8 F (36.6 C) 97.7 F (36.5 C)  SpO2: 98% 98%    Physical Exam: Gen: lethargic, demented, elderly, frail, thin, pleasant, no acute distress  HEENT: anicteric sclera CV: RRR Chest: CTA B Abd: soft, nontender, nondistended, +BS Ext: no edema  Lab Results: Recent Labs    03/16/19 1531 03/17/19 0605  NA 141 142  K 4.8 4.6  CL 109 110  CO2 21* 23  GLUCOSE 92 108*  BUN 41* 42*  CREATININE 1.60* 1.55*  CALCIUM 9.8 9.6   Recent Labs    03/16/19 1531 03/17/19 0605  AST 18 20  ALT 14 15  ALKPHOS 62 58  BILITOT 0.5 0.6  PROT 7.3 6.4*  ALBUMIN 3.6 3.2*   Recent Labs    03/16/19 1531 03/17/19 0605 03/18/19 0803  WBC 6.2 8.7 6.7  NEUTROABS 3.0  --   --   HGB 11.7* 11.1* 12.1  HCT 36.4 35.6* 37.7  MCV 98.4 100.6* 95.9  PLT 169 161 186      Assessment/Plan: Lower GI bleed likely diverticular. Noncontrast CT showed diffuse diverticulosis without any acute findings. Other findings noted in CT report. Suspect bleeding is resolving. Hgb stable at 12.1. Clear liquid diet and advance as tolerated. No plans for colonoscopy due to comorbidities and advanced age. Would watch today and consider d/c later this afternoon if stable (assuming she has family at home with her) vs tomorrow morning. Discussed with Dr. Wyline Copas.   Dorothy Noble 03/18/2019, 9:51 AM  Questions please call [phone] ID: Shara Blazing, female   DOB: [birth year], 85 y.o.   MRN: [id number]

## 2019-03-18 NOTE — Progress Notes (Signed)
Discharge instructions reviewed with pt's daughter.  Copy of instructions given to pt's daughter, no scripts, only new med is colace OTC, daughter informed of this.  Pt d/c'd via wheelchair with belongings, with daughter.         Escorted by unit NT/SITTER

## 2019-03-31 DIAGNOSIS — N289 Disorder of kidney and ureter, unspecified: Secondary | ICD-10-CM | POA: Diagnosis not present

## 2019-03-31 DIAGNOSIS — K5731 Diverticulosis of large intestine without perforation or abscess with bleeding: Secondary | ICD-10-CM | POA: Diagnosis not present

## 2019-03-31 DIAGNOSIS — I700 Atherosclerosis of aorta: Secondary | ICD-10-CM | POA: Diagnosis not present

## 2019-03-31 DIAGNOSIS — D500 Iron deficiency anemia secondary to blood loss (chronic): Secondary | ICD-10-CM | POA: Diagnosis not present

## 2019-03-31 DIAGNOSIS — E46 Unspecified protein-calorie malnutrition: Secondary | ICD-10-CM | POA: Diagnosis not present

## 2019-03-31 DIAGNOSIS — N183 Chronic kidney disease, stage 3 (moderate): Secondary | ICD-10-CM | POA: Diagnosis not present

## 2019-03-31 DIAGNOSIS — I129 Hypertensive chronic kidney disease with stage 1 through stage 4 chronic kidney disease, or unspecified chronic kidney disease: Secondary | ICD-10-CM | POA: Diagnosis not present

## 2019-04-14 DIAGNOSIS — D500 Iron deficiency anemia secondary to blood loss (chronic): Secondary | ICD-10-CM | POA: Diagnosis not present

## 2019-04-14 DIAGNOSIS — N289 Disorder of kidney and ureter, unspecified: Secondary | ICD-10-CM | POA: Diagnosis not present

## 2019-05-12 DIAGNOSIS — N183 Chronic kidney disease, stage 3 (moderate): Secondary | ICD-10-CM | POA: Diagnosis not present

## 2019-05-12 DIAGNOSIS — K5731 Diverticulosis of large intestine without perforation or abscess with bleeding: Secondary | ICD-10-CM | POA: Diagnosis not present

## 2019-05-12 DIAGNOSIS — I129 Hypertensive chronic kidney disease with stage 1 through stage 4 chronic kidney disease, or unspecified chronic kidney disease: Secondary | ICD-10-CM | POA: Diagnosis not present

## 2019-05-12 DIAGNOSIS — K219 Gastro-esophageal reflux disease without esophagitis: Secondary | ICD-10-CM | POA: Diagnosis not present

## 2019-05-12 DIAGNOSIS — R413 Other amnesia: Secondary | ICD-10-CM | POA: Diagnosis not present

## 2019-05-14 DIAGNOSIS — D500 Iron deficiency anemia secondary to blood loss (chronic): Secondary | ICD-10-CM | POA: Diagnosis not present

## 2019-06-18 DIAGNOSIS — N939 Abnormal uterine and vaginal bleeding, unspecified: Secondary | ICD-10-CM | POA: Diagnosis not present

## 2019-06-18 DIAGNOSIS — H9390 Unspecified disorder of ear, unspecified ear: Secondary | ICD-10-CM | POA: Diagnosis not present

## 2019-07-19 DIAGNOSIS — Z23 Encounter for immunization: Secondary | ICD-10-CM | POA: Diagnosis not present

## 2019-07-30 DIAGNOSIS — R319 Hematuria, unspecified: Secondary | ICD-10-CM | POA: Diagnosis not present

## 2019-07-31 DIAGNOSIS — B373 Candidiasis of vulva and vagina: Secondary | ICD-10-CM | POA: Diagnosis not present

## 2019-08-12 DIAGNOSIS — R319 Hematuria, unspecified: Secondary | ICD-10-CM | POA: Diagnosis not present

## 2019-09-02 DIAGNOSIS — N200 Calculus of kidney: Secondary | ICD-10-CM | POA: Diagnosis not present

## 2019-09-02 DIAGNOSIS — D494 Neoplasm of unspecified behavior of bladder: Secondary | ICD-10-CM | POA: Diagnosis not present

## 2019-09-02 DIAGNOSIS — R310 Gross hematuria: Secondary | ICD-10-CM | POA: Diagnosis not present

## 2019-09-10 DIAGNOSIS — R310 Gross hematuria: Secondary | ICD-10-CM | POA: Diagnosis not present

## 2019-09-10 DIAGNOSIS — D494 Neoplasm of unspecified behavior of bladder: Secondary | ICD-10-CM | POA: Diagnosis not present

## 2019-09-12 DIAGNOSIS — K219 Gastro-esophageal reflux disease without esophagitis: Secondary | ICD-10-CM | POA: Diagnosis not present

## 2019-09-12 DIAGNOSIS — Z1389 Encounter for screening for other disorder: Secondary | ICD-10-CM | POA: Diagnosis not present

## 2019-09-12 DIAGNOSIS — I129 Hypertensive chronic kidney disease with stage 1 through stage 4 chronic kidney disease, or unspecified chronic kidney disease: Secondary | ICD-10-CM | POA: Diagnosis not present

## 2019-09-12 DIAGNOSIS — E7800 Pure hypercholesterolemia, unspecified: Secondary | ICD-10-CM | POA: Diagnosis not present

## 2019-09-12 DIAGNOSIS — Z0000 Encounter for general adult medical examination without abnormal findings: Secondary | ICD-10-CM | POA: Diagnosis not present

## 2019-09-12 DIAGNOSIS — K5731 Diverticulosis of large intestine without perforation or abscess with bleeding: Secondary | ICD-10-CM | POA: Diagnosis not present

## 2019-09-12 DIAGNOSIS — E039 Hypothyroidism, unspecified: Secondary | ICD-10-CM | POA: Diagnosis not present

## 2019-09-12 DIAGNOSIS — I509 Heart failure, unspecified: Secondary | ICD-10-CM | POA: Diagnosis not present

## 2019-09-12 DIAGNOSIS — I779 Disorder of arteries and arterioles, unspecified: Secondary | ICD-10-CM | POA: Diagnosis not present

## 2019-09-12 DIAGNOSIS — N183 Chronic kidney disease, stage 3 unspecified: Secondary | ICD-10-CM | POA: Diagnosis not present

## 2019-10-09 DIAGNOSIS — D494 Neoplasm of unspecified behavior of bladder: Secondary | ICD-10-CM | POA: Diagnosis not present

## 2019-10-09 DIAGNOSIS — R319 Hematuria, unspecified: Secondary | ICD-10-CM | POA: Diagnosis not present

## 2019-10-09 DIAGNOSIS — K5731 Diverticulosis of large intestine without perforation or abscess with bleeding: Secondary | ICD-10-CM | POA: Diagnosis not present

## 2019-10-09 DIAGNOSIS — I129 Hypertensive chronic kidney disease with stage 1 through stage 4 chronic kidney disease, or unspecified chronic kidney disease: Secondary | ICD-10-CM | POA: Diagnosis not present

## 2019-10-09 DIAGNOSIS — E039 Hypothyroidism, unspecified: Secondary | ICD-10-CM | POA: Diagnosis not present

## 2019-11-15 ENCOUNTER — Ambulatory Visit: Payer: Medicare HMO | Attending: Internal Medicine

## 2019-11-15 DIAGNOSIS — Z23 Encounter for immunization: Secondary | ICD-10-CM | POA: Insufficient documentation

## 2019-11-15 NOTE — Progress Notes (Signed)
   Covid-19 Vaccination Clinic  Name:  Dorothy Noble    MRN: [id number] DOB: 04/05/1922  11/15/2019  [name] was observed post Covid-19 immunization for 15 minutes without incidence. She was provided with Vaccine Information Sheet and instruction to access the V-Safe system.   [name] was instructed to call 911 with any severe reactions post vaccine: Marland Kitchen Difficulty breathing  . Swelling of your face and throat  . A fast heartbeat  . A bad rash all over your body  . Dizziness and weakness    Immunizations Administered    Name Date Dose VIS Date Route   Pfizer COVID-19 Vaccine 11/15/2019  3:16 PM 0.3 mL 09/05/2019 Intramuscular   Manufacturer: [place]   Lot: [id number]   [place]: [id number]

## 2019-12-08 ENCOUNTER — Ambulatory Visit: Payer: Medicare HMO

## 2019-12-10 ENCOUNTER — Ambulatory Visit: Payer: Medicare HMO | Attending: Internal Medicine

## 2019-12-10 DIAGNOSIS — Z23 Encounter for immunization: Secondary | ICD-10-CM

## 2019-12-10 NOTE — Progress Notes (Signed)
   Covid-19 Vaccination Clinic  Name:  Dorothy Noble    MRN: [id number] DOB: [birth year]  12/10/2019  [name] was observed post Covid-19 immunization for 15 minutes without incident. She was provided with Vaccine Information Sheet and instruction to access the V-Safe system.   [name] was instructed to call 911 with any severe reactions post vaccine: Marland Kitchen Difficulty breathing  . Swelling of face and throat  . A fast heartbeat  . A bad rash all over body  . Dizziness and weakness   Immunizations Administered    Name Date Dose VIS Date Route   Pfizer COVID-19 Vaccine 12/10/2019  1:01 PM 0.3 mL 09/05/2019 Intramuscular   Manufacturer: [place]   Lot: [id number]   [place]: [id number]

## 2020-01-08 DIAGNOSIS — Z008 Encounter for other general examination: Secondary | ICD-10-CM | POA: Diagnosis not present

## 2020-01-08 DIAGNOSIS — M1990 Unspecified osteoarthritis, unspecified site: Secondary | ICD-10-CM | POA: Diagnosis not present

## 2020-01-08 DIAGNOSIS — Z8673 Personal history of transient ischemic attack (TIA), and cerebral infarction without residual deficits: Secondary | ICD-10-CM | POA: Diagnosis not present

## 2020-01-08 DIAGNOSIS — K08409 Partial loss of teeth, unspecified cause, unspecified class: Secondary | ICD-10-CM | POA: Diagnosis not present

## 2020-01-08 DIAGNOSIS — Z8249 Family history of ischemic heart disease and other diseases of the circulatory system: Secondary | ICD-10-CM | POA: Diagnosis not present

## 2020-01-08 DIAGNOSIS — I739 Peripheral vascular disease, unspecified: Secondary | ICD-10-CM | POA: Diagnosis not present

## 2020-01-08 DIAGNOSIS — I10 Essential (primary) hypertension: Secondary | ICD-10-CM | POA: Diagnosis not present

## 2020-01-08 DIAGNOSIS — R69 Illness, unspecified: Secondary | ICD-10-CM | POA: Diagnosis not present

## 2020-01-08 DIAGNOSIS — G8929 Other chronic pain: Secondary | ICD-10-CM | POA: Diagnosis not present

## 2020-01-08 DIAGNOSIS — I499 Cardiac arrhythmia, unspecified: Secondary | ICD-10-CM | POA: Diagnosis not present

## 2020-01-27 DIAGNOSIS — E039 Hypothyroidism, unspecified: Secondary | ICD-10-CM | POA: Diagnosis not present

## 2020-04-12 DIAGNOSIS — I890 Lymphedema, not elsewhere classified: Secondary | ICD-10-CM | POA: Diagnosis not present

## 2020-04-12 DIAGNOSIS — L98491 Non-pressure chronic ulcer of skin of other sites limited to breakdown of skin: Secondary | ICD-10-CM | POA: Diagnosis not present

## 2020-04-12 DIAGNOSIS — M48061 Spinal stenosis, lumbar region without neurogenic claudication: Secondary | ICD-10-CM | POA: Diagnosis not present

## 2020-04-12 DIAGNOSIS — M1990 Unspecified osteoarthritis, unspecified site: Secondary | ICD-10-CM | POA: Diagnosis not present

## 2020-04-12 DIAGNOSIS — R413 Other amnesia: Secondary | ICD-10-CM | POA: Diagnosis not present

## 2020-04-15 ENCOUNTER — Ambulatory Visit: Payer: Medicare HMO | Admitting: Podiatry

## 2020-04-15 ENCOUNTER — Other Ambulatory Visit: Payer: Self-pay

## 2020-04-15 DIAGNOSIS — M79676 Pain in unspecified toe(s): Secondary | ICD-10-CM

## 2020-04-15 DIAGNOSIS — B351 Tinea unguium: Secondary | ICD-10-CM | POA: Diagnosis not present

## 2020-04-15 NOTE — Progress Notes (Signed)
  Subjective:  Patient ID: Dorothy Noble, female    DOB: 1922/05/29,  MRN: [id number]  Chief Complaint  Patient presents with  . Nail Problem    pt is here for a nail trim    86 y.o. female presents with the above complaint. History confirmed with patient.   Objective:  Physical Exam: warm, good capillary refill, onychomycosis of fthe toenails with pain to palpation, no trophic changes or ulcerative lesions. DP pulses palpable, PT pulses palpable and protective sensation intact Left Foot: normal exam, no swelling, tenderness, instability; ligaments intact, full range of motion of all ankle/foot joints  Right Foot: normal exam, no swelling, tenderness, instability; ligaments intact, full range of motion of all ankle/foot joints   No images are attached to the encounter.  Assessment:   1. Pain due to onychomycosis of nail      Plan:  Patient was evaluated and treated and all questions answered.  Onychomycosis -Nails palliatively debrided secondary to pain    Procedure: Nail Debridement Rationale: Pain Type of Debridement: manual, sharp debridement. Instrumentation: Nail nipper, rotary burr. Number of Nails: 10     No follow-ups on file.

## 2020-04-20 DIAGNOSIS — M48061 Spinal stenosis, lumbar region without neurogenic claudication: Secondary | ICD-10-CM | POA: Diagnosis not present

## 2020-04-20 DIAGNOSIS — R319 Hematuria, unspecified: Secondary | ICD-10-CM | POA: Diagnosis not present

## 2020-04-20 DIAGNOSIS — D494 Neoplasm of unspecified behavior of bladder: Secondary | ICD-10-CM | POA: Diagnosis not present

## 2020-05-21 DIAGNOSIS — R319 Hematuria, unspecified: Secondary | ICD-10-CM | POA: Diagnosis not present

## 2020-05-21 DIAGNOSIS — M48061 Spinal stenosis, lumbar region without neurogenic claudication: Secondary | ICD-10-CM | POA: Diagnosis not present

## 2020-05-21 DIAGNOSIS — D494 Neoplasm of unspecified behavior of bladder: Secondary | ICD-10-CM | POA: Diagnosis not present

## 2020-06-14 DIAGNOSIS — D494 Neoplasm of unspecified behavior of bladder: Secondary | ICD-10-CM | POA: Diagnosis not present

## 2020-06-14 DIAGNOSIS — R310 Gross hematuria: Secondary | ICD-10-CM | POA: Diagnosis not present

## 2020-06-21 DIAGNOSIS — D494 Neoplasm of unspecified behavior of bladder: Secondary | ICD-10-CM | POA: Diagnosis not present

## 2020-06-21 DIAGNOSIS — M48061 Spinal stenosis, lumbar region without neurogenic claudication: Secondary | ICD-10-CM | POA: Diagnosis not present

## 2020-06-21 DIAGNOSIS — R319 Hematuria, unspecified: Secondary | ICD-10-CM | POA: Diagnosis not present

## 2020-07-04 IMAGING — CT CT MAXILLOFACIAL W/O CM
3 series · 16 of 47 positions shown, 19 images · non-contrast
Comparison: None.

CLINICAL DATA: Facial trauma, fall

EXAM:
CT MAXILLOFACIAL WITHOUT CONTRAST
TECHNIQUE: Multidetector CT imaging of the maxillofacial structures was
performed. Multiplanar CT image reconstructions were also generated.

[Series 3: max soft · axial · 0.33mm/px · z∈[-158,-34]mm · 10 of 72 slices shown, 13 images]
[im 5/72  brain]
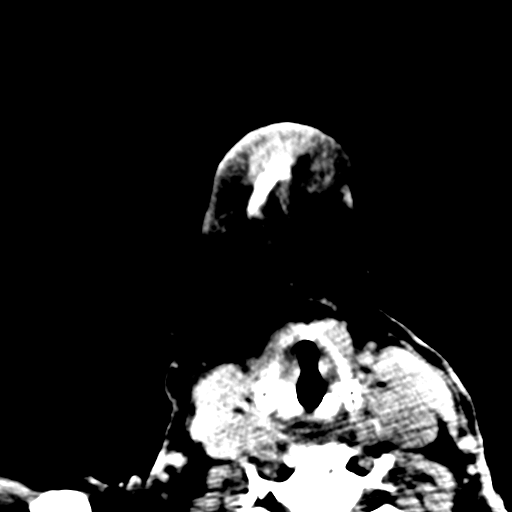
[im 5/72  bone]
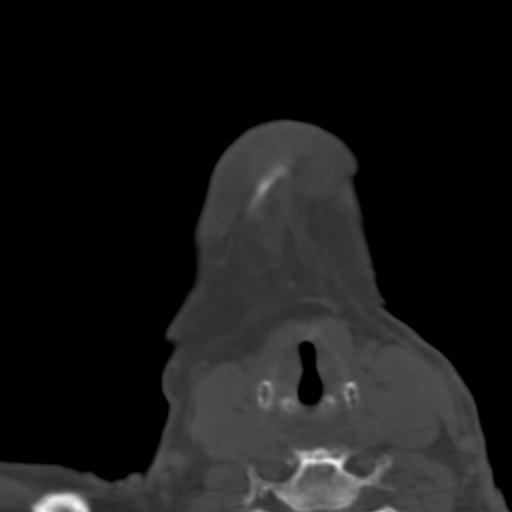
[im 13/72  bone]
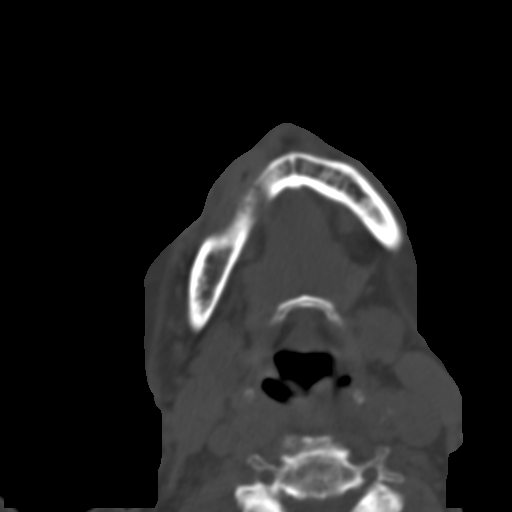
[im 20/72  bone]
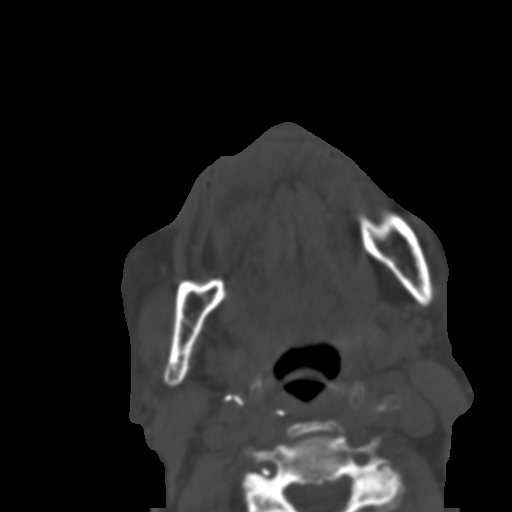
[im 25/72  bone]
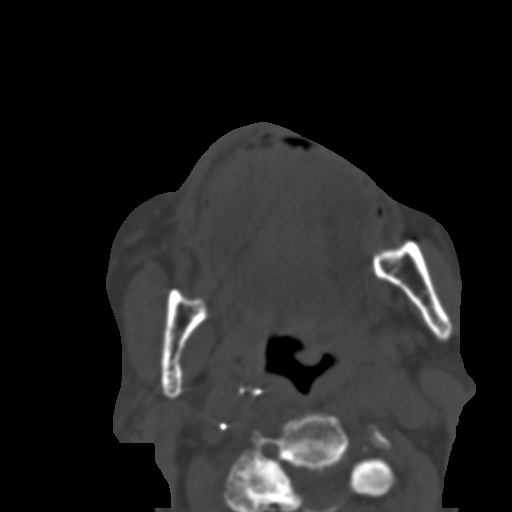
[im 32/72  brain]
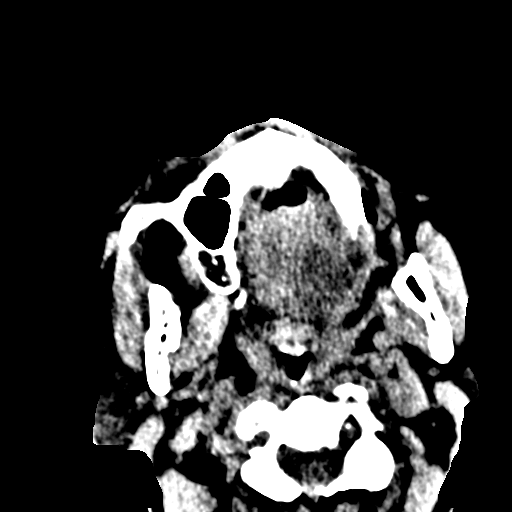
[im 32/72  bone]
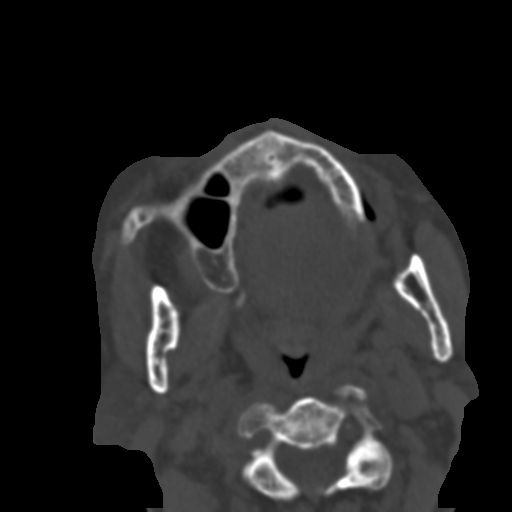
[im 40/72  bone]
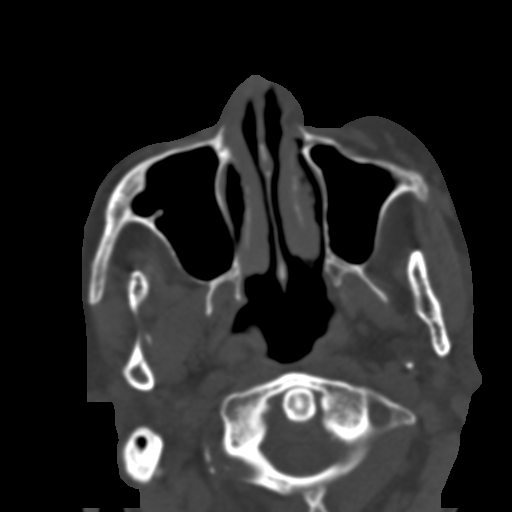
[im 47/72  bone]
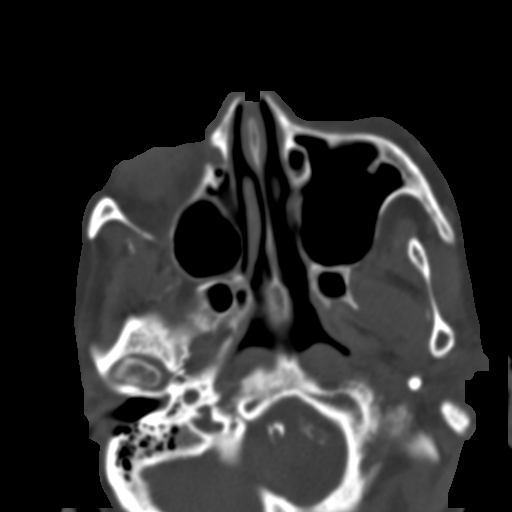
[im 54/72  bone]
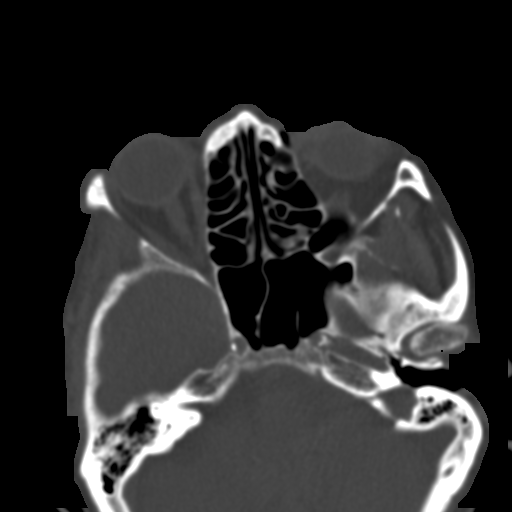
[im 59/72  brain]
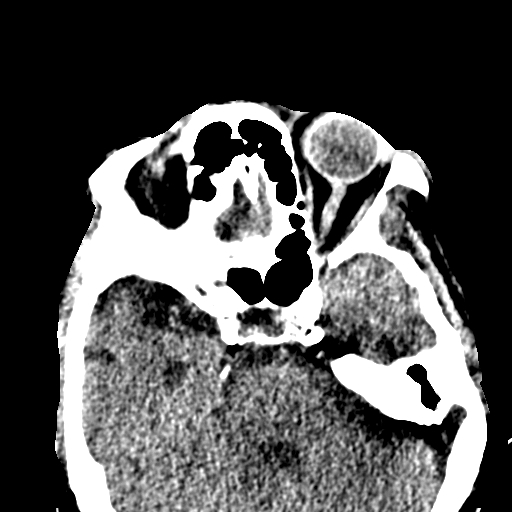
[im 59/72  bone]
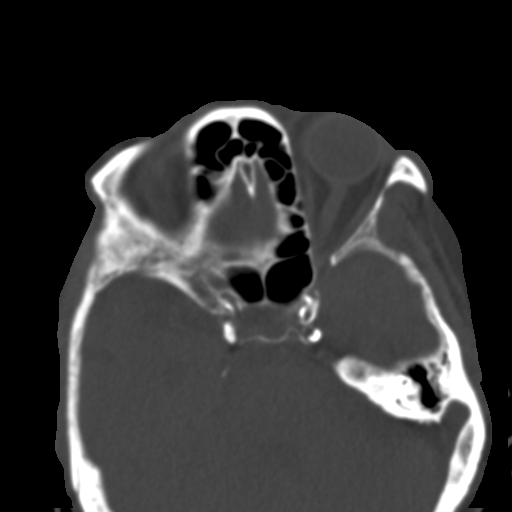
[im 67/72  bone]
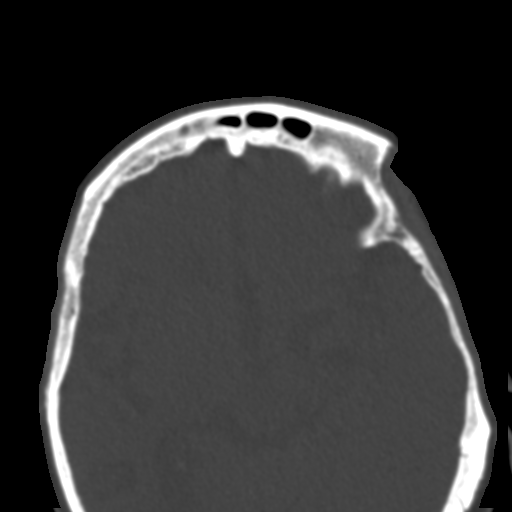

[Series 7: coronal soft · coronal · 0.29mm/px · 3 of 76 slices shown]
[im 26/76  bone]
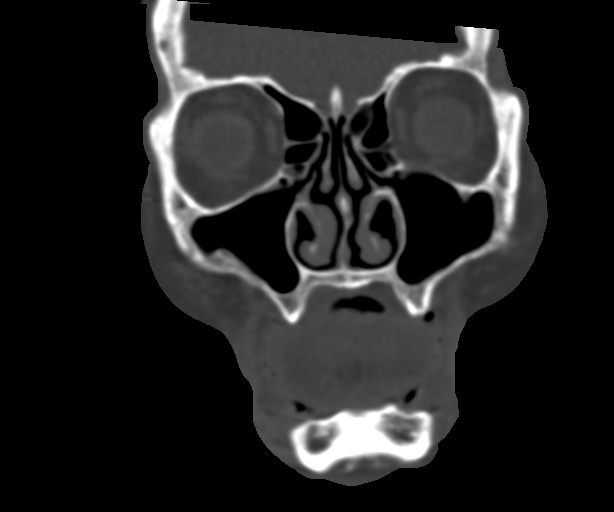
[im 34/76  bone]
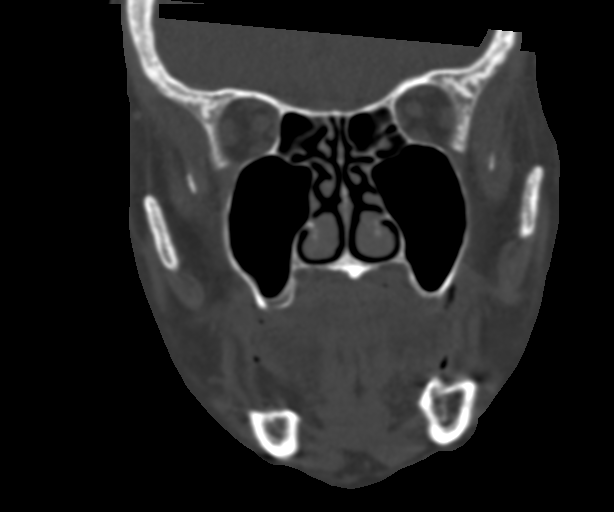
[im 42/76  bone]
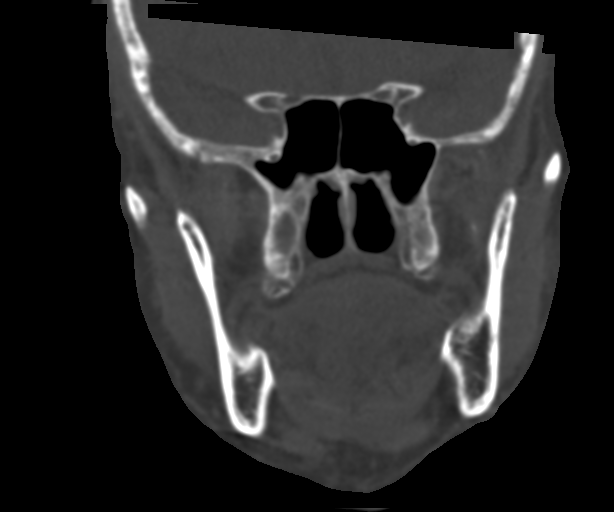

[Series 8: sagittal soft · sagittal · 0.31mm/px · 3 of 75 slices shown]
[im 25/75  bone]
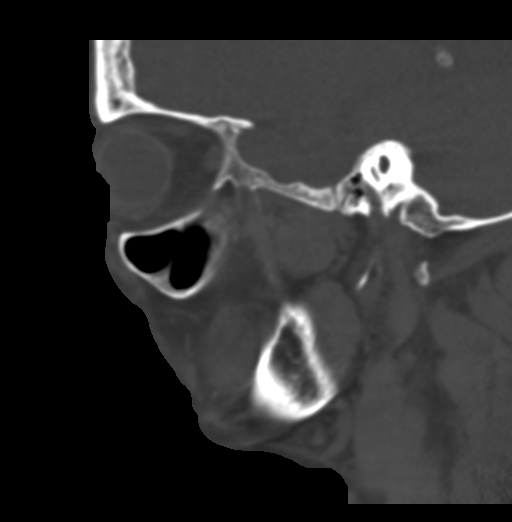
[im 38/75  bone]
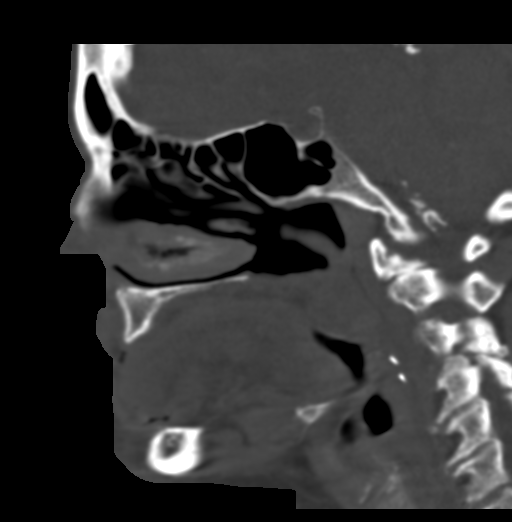
[im 50/75  bone]
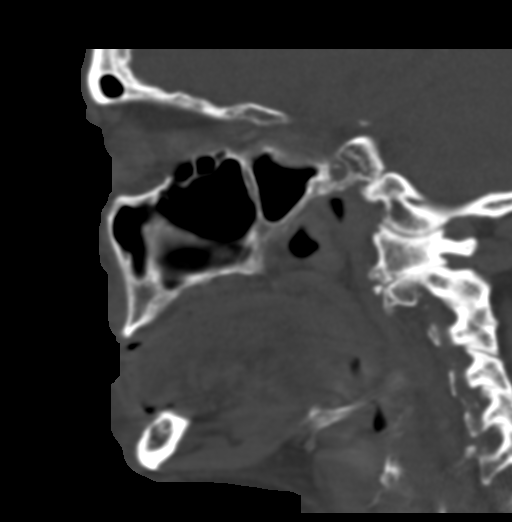

[16 of 47 positions shown; findings below may reference images not displayed]

FINDINGS: Osseous: No fracture or mandibular dislocation. No destructive
process.

Orbits: Negative. No traumatic or inflammatory finding.

Sinuses: Clear

Soft tissues: Negative

Limited intracranial: No significant or unexpected finding.
IMPRESSION: No evidence of facial or orbital fracture.

## 2020-07-07 DIAGNOSIS — R3915 Urgency of urination: Secondary | ICD-10-CM | POA: Diagnosis not present

## 2020-07-07 DIAGNOSIS — R350 Frequency of micturition: Secondary | ICD-10-CM | POA: Diagnosis not present

## 2020-07-07 DIAGNOSIS — R8279 Other abnormal findings on microbiological examination of urine: Secondary | ICD-10-CM | POA: Diagnosis not present

## 2020-07-07 DIAGNOSIS — D494 Neoplasm of unspecified behavior of bladder: Secondary | ICD-10-CM | POA: Diagnosis not present

## 2020-07-21 ENCOUNTER — Telehealth: Payer: Self-pay | Admitting: Internal Medicine

## 2020-07-21 DIAGNOSIS — M48061 Spinal stenosis, lumbar region without neurogenic claudication: Secondary | ICD-10-CM | POA: Diagnosis not present

## 2020-07-21 DIAGNOSIS — R319 Hematuria, unspecified: Secondary | ICD-10-CM | POA: Diagnosis not present

## 2020-07-21 DIAGNOSIS — D494 Neoplasm of unspecified behavior of bladder: Secondary | ICD-10-CM | POA: Diagnosis not present

## 2020-07-21 NOTE — Telephone Encounter (Signed)
Called patient's daughter, Enid Derry, to offer to schedule a Palliative visit, no answer - left message with reason for call along with my name and call back number.

## 2020-08-21 DIAGNOSIS — R319 Hematuria, unspecified: Secondary | ICD-10-CM | POA: Diagnosis not present

## 2020-08-21 DIAGNOSIS — M48061 Spinal stenosis, lumbar region without neurogenic claudication: Secondary | ICD-10-CM | POA: Diagnosis not present

## 2020-08-21 DIAGNOSIS — D494 Neoplasm of unspecified behavior of bladder: Secondary | ICD-10-CM | POA: Diagnosis not present

## 2020-08-25 DEATH — deceased

## 2020-10-24 IMAGING — CT CT ABDOMEN AND PELVIS WITHOUT CONTRAST
2 of 4 series · 16 of 46 positions shown, 18 images · non-contrast
Comparison: None.

CLINICAL DATA: GI bleeding with abdominal pain

EXAM:
CT ABDOMEN AND PELVIS WITHOUT CONTRAST
TECHNIQUE: Multidetector CT imaging of the abdomen and pelvis was performed
following the standard protocol without IV contrast.

[Series 3: a/p w/o 5mm · axial · non-contrast · 0.62mm/px · z∈[+734,+1089]mm · 13 of 79 slices shown, 15 images]
[im 4/79  soft-tissue]
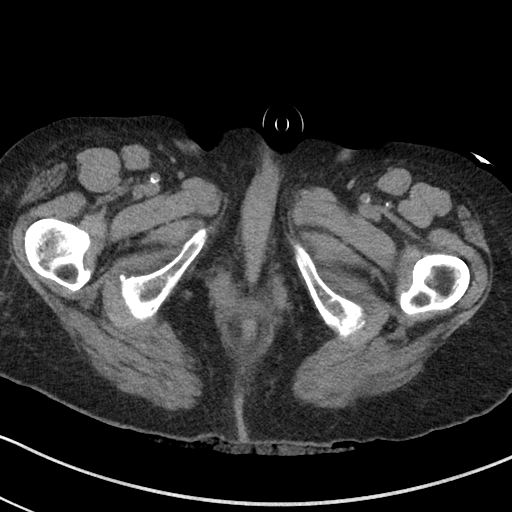
[im 4/79  bone]
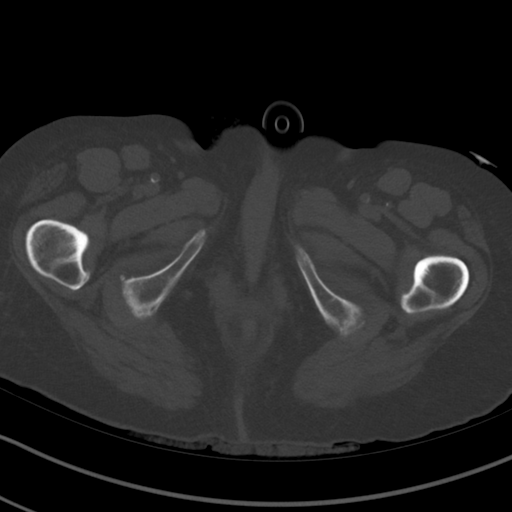
[im 10/79  soft-tissue]
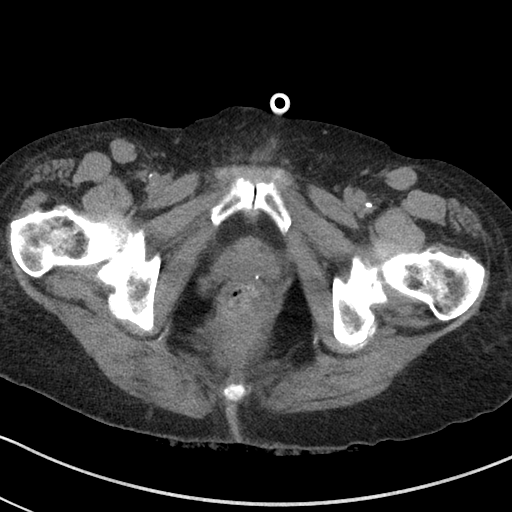
[im 17/79  soft-tissue]
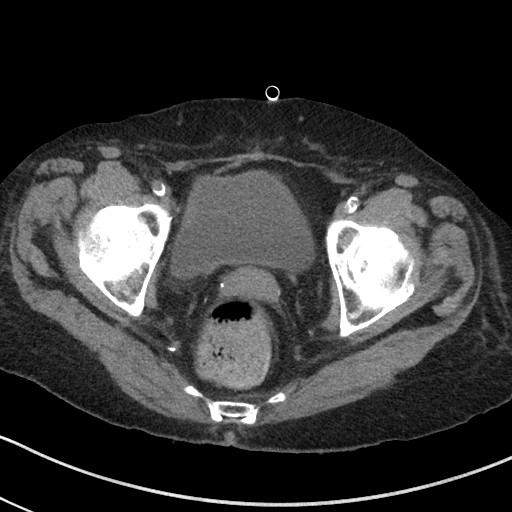
[im 23/79  soft-tissue]
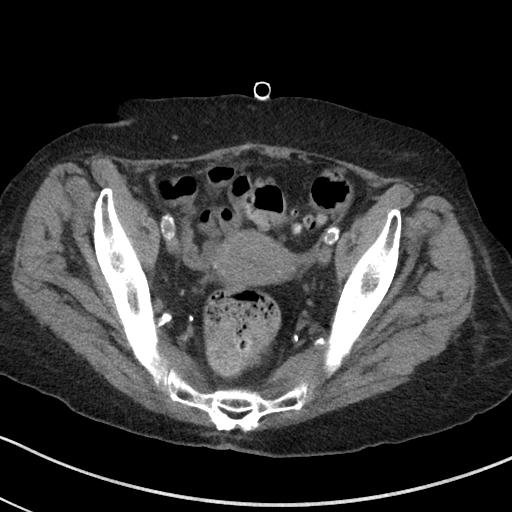
[im 27/79  soft-tissue]
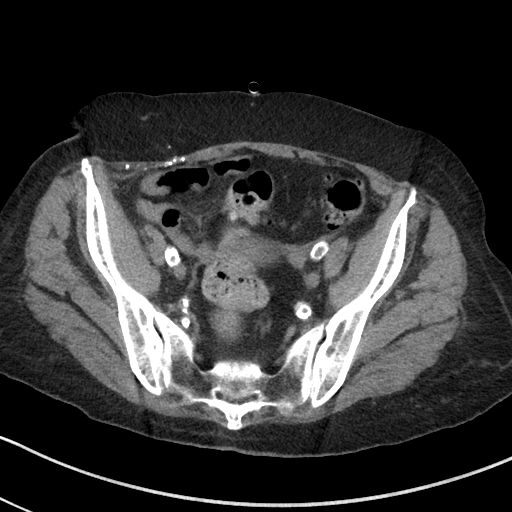
[im 33/79  soft-tissue]
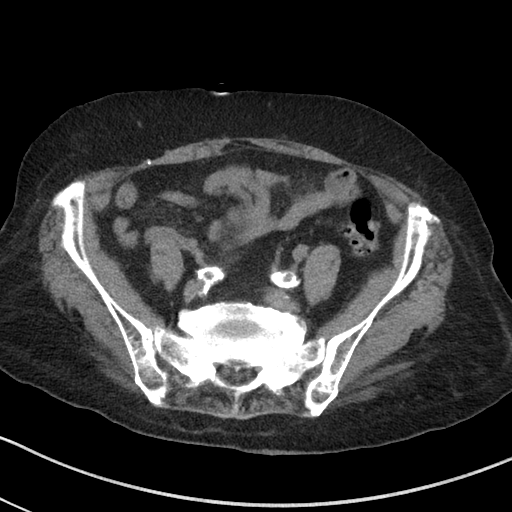
[im 40/79  soft-tissue]
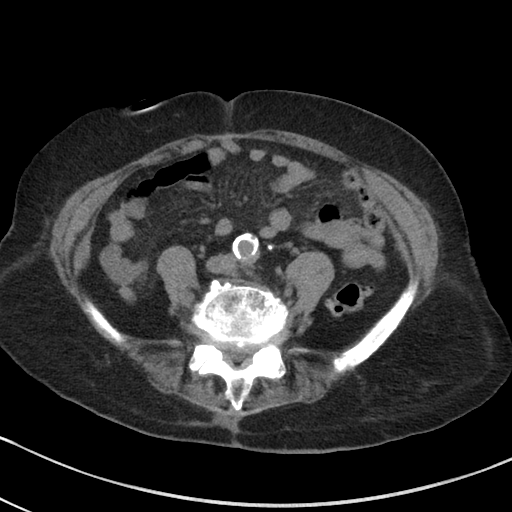
[im 46/79  soft-tissue]
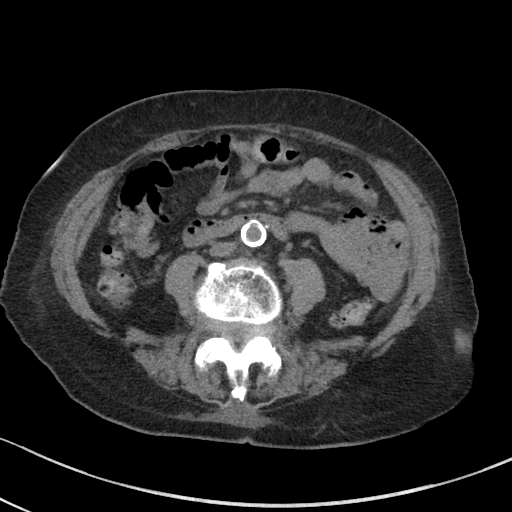
[im 53/79  soft-tissue]
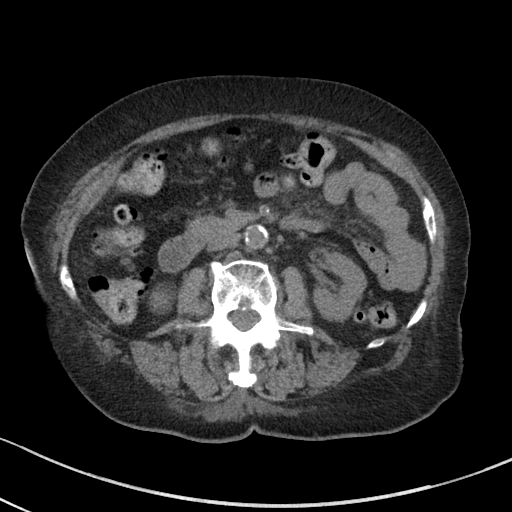
[im 53/79  bone]
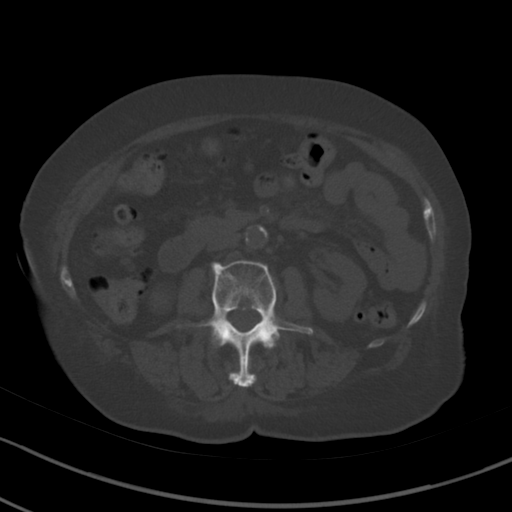
[im 56/79  soft-tissue]
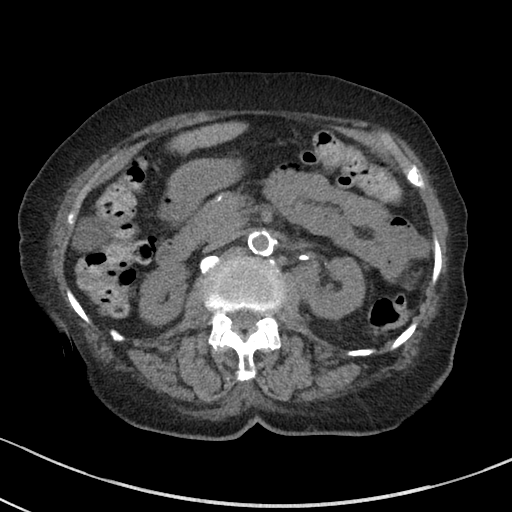
[im 62/79  soft-tissue]
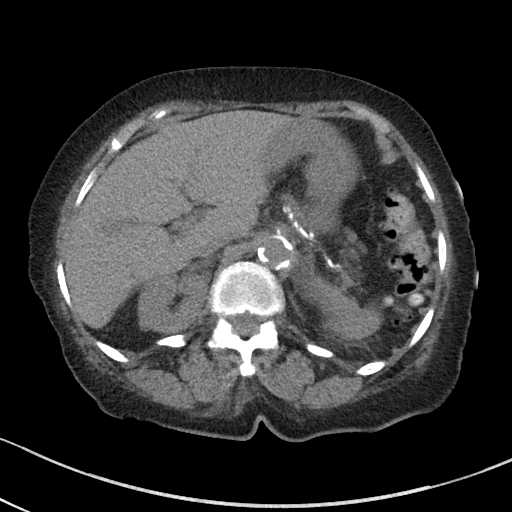
[im 69/79  soft-tissue]
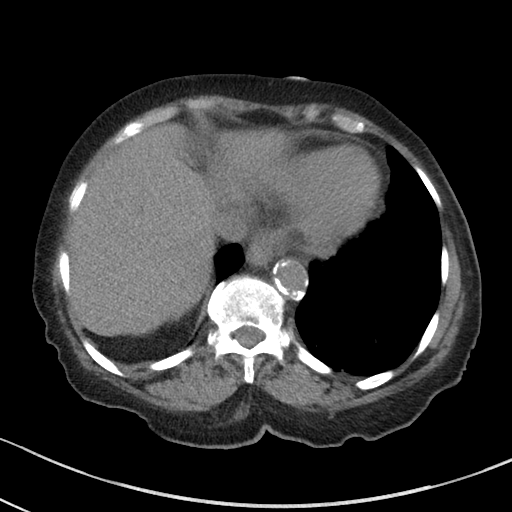
[im 75/79  soft-tissue]
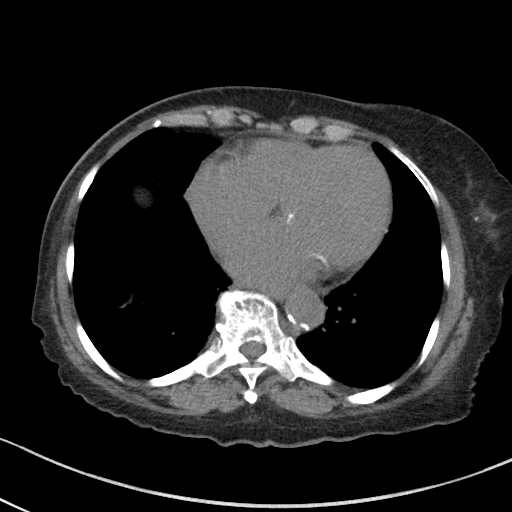

[Series 6: a/p w/o cor · coronal · non-contrast · 0.60mm/px · 3 of 138 slices shown]
[im 46/138  soft-tissue]
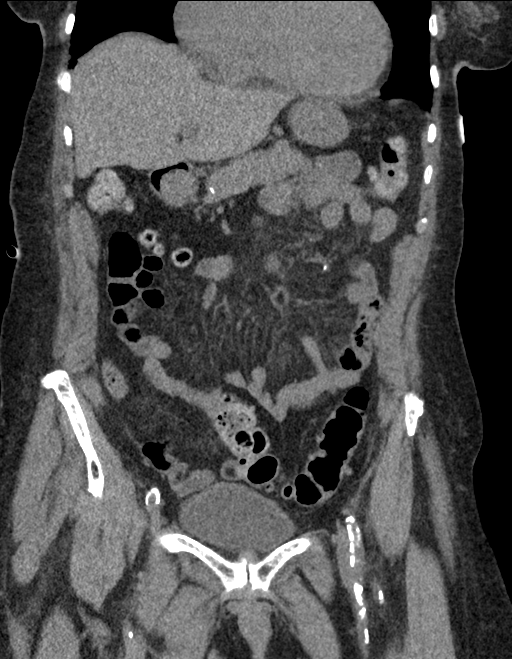
[im 61/138  soft-tissue]
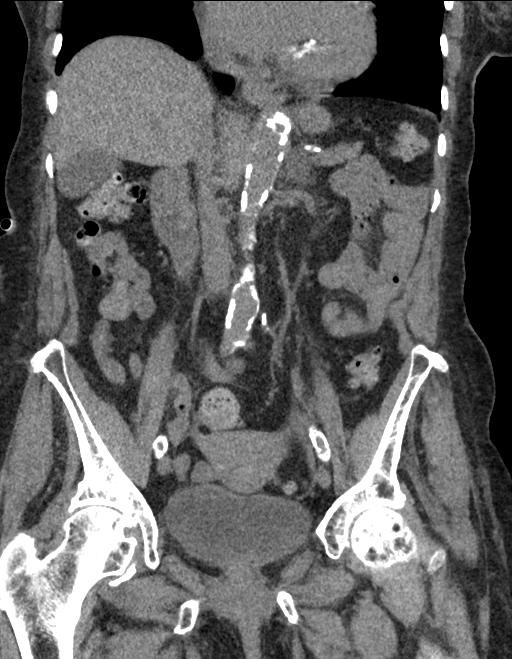
[im 77/138  soft-tissue]
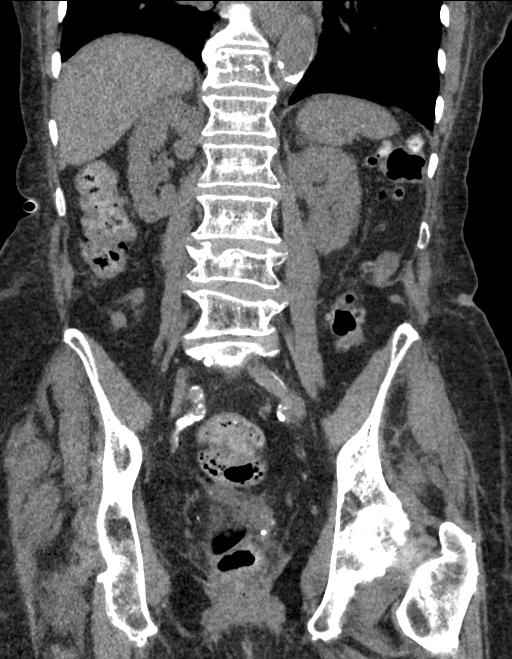

[16 of 46 positions shown; findings below may reference images not displayed]

FINDINGS: Lower chest: Calcified granuloma identified in the right middle
lobe.

Hepatobiliary: No focal liver abnormality is seen. No gallstones,
gallbladder wall thickening, or biliary dilatation.

Pancreas: Unremarkable. No pancreatic ductal dilatation or
surrounding inflammatory changes.

Spleen: Normal in size without focal abnormality.

Adrenals/Urinary Tract: Normal appearance of the adrenal glands.
Small nonobstructing stone within upper pole of left kidney measures
2 mm. No right kidney stone. No hydronephrosis or mass noted
bilaterally. Urinary bladder normal.

Stomach/Bowel: Stomach appears normal. No dilated loops of small
bowel. The appendix is visualized and appears normal. Numerous
colonic diverticula are identified throughout the colon. No wall
thickening or inflammation identified.

Vascular/Lymphatic: Extensive aortic atherosclerosis with branch
vessel disease identified. There is a peripherally calcified right
renal artery aneurysm which measures 1.1 cm. No abdominopelvic
adenopathy identified. No inguinal adenopathy.

Reproductive: Uterus and bilateral adnexa are unremarkable.

Other: No free fluid or fluid collections.

Musculoskeletal: The bones appear osteopenic. There are compression
deformities involving L3 and L4. 50% loss of height at L3 and
approximately 20% superior endplate height loss at L4. Multi level
degenerative disc disease is noted. This is most advanced at L3-4
where there is vacuum disc phenomenon and a first degree
anterolisthesis of L3 on L4.
IMPRESSION: 1. No acute findings within the abdomen or pelvis.
2. Extensive colonic diverticulosis without acute inflammation.
3. Nonobstructing left renal calculus.
4. Extensive aortic atherosclerosis with branch vessel disease and
peripherally calcified road renal artery aneurysm. Aortic
Atherosclerosis ([id number]).
5. Age-indeterminate L3 and L4 compression deformities.
6. Lumbar spondylosis
